# Patient Record
Sex: Male | Born: 1950 | Race: White | Hispanic: No | Marital: Married | Smoking: Former smoker
Health system: Southern US, Community
[De-identification: ages and names within clinical notes are randomized; demographics above are authoritative.]

## PROBLEM LIST (undated history)

## (undated) DIAGNOSIS — M545 Low back pain, unspecified: Secondary | ICD-10-CM

## (undated) DIAGNOSIS — N529 Male erectile dysfunction, unspecified: Secondary | ICD-10-CM

## (undated) DIAGNOSIS — Z860100 Personal history of colon polyps, unspecified: Secondary | ICD-10-CM

## (undated) DIAGNOSIS — F32A Depression, unspecified: Secondary | ICD-10-CM

## (undated) DIAGNOSIS — F419 Anxiety disorder, unspecified: Secondary | ICD-10-CM

## (undated) DIAGNOSIS — T7840XA Allergy, unspecified, initial encounter: Secondary | ICD-10-CM

## (undated) DIAGNOSIS — K219 Gastro-esophageal reflux disease without esophagitis: Secondary | ICD-10-CM

## (undated) DIAGNOSIS — E669 Obesity, unspecified: Secondary | ICD-10-CM

## (undated) DIAGNOSIS — G4733 Obstructive sleep apnea (adult) (pediatric): Secondary | ICD-10-CM

## (undated) DIAGNOSIS — F329 Major depressive disorder, single episode, unspecified: Secondary | ICD-10-CM

## (undated) DIAGNOSIS — Z9989 Dependence on other enabling machines and devices: Secondary | ICD-10-CM

## (undated) DIAGNOSIS — E119 Type 2 diabetes mellitus without complications: Secondary | ICD-10-CM

## (undated) DIAGNOSIS — R7309 Other abnormal glucose: Secondary | ICD-10-CM

## (undated) DIAGNOSIS — M1990 Unspecified osteoarthritis, unspecified site: Secondary | ICD-10-CM

## (undated) HISTORY — DX: Dependence on other enabling machines and devices: Z99.89

## (undated) HISTORY — DX: Allergy, unspecified, initial encounter: T78.40XA

## (undated) HISTORY — DX: Low back pain: M54.5

## (undated) HISTORY — DX: Obesity, unspecified: E66.9

## (undated) HISTORY — PX: KNEE LIGAMENT RECONSTRUCTION: SHX1895

## (undated) HISTORY — DX: Gastro-esophageal reflux disease without esophagitis: K21.9

## (undated) HISTORY — DX: Low back pain, unspecified: M54.50

## (undated) HISTORY — DX: Type 2 diabetes mellitus without complications: E11.9

## (undated) HISTORY — DX: Obstructive sleep apnea (adult) (pediatric): G47.33

## (undated) HISTORY — DX: Personal history of colonic polyps: Z86.010

## (undated) HISTORY — DX: Depression, unspecified: F32.A

## (undated) HISTORY — PX: TONSILLECTOMY: SUR1361

## (undated) HISTORY — DX: Major depressive disorder, single episode, unspecified: F32.9

## (undated) HISTORY — DX: Anxiety disorder, unspecified: F41.9

## (undated) HISTORY — PX: UPPER GASTROINTESTINAL ENDOSCOPY: SHX188

## (undated) HISTORY — PX: COLONOSCOPY: SHX174

## (undated) HISTORY — DX: Other abnormal glucose: R73.09

## (undated) HISTORY — DX: Personal history of colon polyps, unspecified: Z86.0100

## (undated) HISTORY — DX: Male erectile dysfunction, unspecified: N52.9

---
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
## 1979-02-20 HISTORY — PX: VARICOSE VEIN SURGERY: SHX832

## 1997-09-19 ENCOUNTER — Ambulatory Visit: Admission: RE | Admit: 1997-09-19 | Discharge: 1997-09-19 | Payer: Self-pay | Admitting: Pulmonary Disease

## 1998-01-19 ENCOUNTER — Ambulatory Visit: Admission: RE | Admit: 1998-01-19 | Discharge: 1998-01-19 | Payer: Self-pay | Admitting: Pulmonary Disease

## 1998-11-27 ENCOUNTER — Encounter: Admission: RE | Admit: 1998-11-27 | Discharge: 1998-11-27 | Payer: Self-pay | Admitting: *Deleted

## 1998-11-27 ENCOUNTER — Encounter: Payer: Self-pay | Admitting: Internal Medicine

## 2006-01-10 ENCOUNTER — Ambulatory Visit: Payer: Self-pay | Admitting: Internal Medicine

## 2006-01-20 ENCOUNTER — Ambulatory Visit: Payer: Self-pay

## 2006-02-15 ENCOUNTER — Ambulatory Visit: Payer: Self-pay | Admitting: Internal Medicine

## 2006-07-04 ENCOUNTER — Ambulatory Visit: Payer: Self-pay | Admitting: Internal Medicine

## 2006-10-03 ENCOUNTER — Ambulatory Visit: Payer: Self-pay | Admitting: Internal Medicine

## 2007-04-19 ENCOUNTER — Encounter: Payer: Self-pay | Admitting: Internal Medicine

## 2007-04-19 ENCOUNTER — Ambulatory Visit: Payer: Self-pay | Admitting: Internal Medicine

## 2007-04-19 DIAGNOSIS — F329 Major depressive disorder, single episode, unspecified: Secondary | ICD-10-CM

## 2007-04-19 DIAGNOSIS — F411 Generalized anxiety disorder: Secondary | ICD-10-CM | POA: Insufficient documentation

## 2007-04-19 DIAGNOSIS — K219 Gastro-esophageal reflux disease without esophagitis: Secondary | ICD-10-CM | POA: Insufficient documentation

## 2007-05-01 ENCOUNTER — Ambulatory Visit: Payer: Self-pay | Admitting: Internal Medicine

## 2007-05-01 LAB — CONVERTED CEMR LAB
ALT: 23 units/L (ref 0–53)
AST: 24 units/L (ref 0–37)
Albumin: 4 g/dL (ref 3.5–5.2)
Alkaline Phosphatase: 48 units/L (ref 39–117)
BUN: 11 mg/dL (ref 6–23)
Basophils Absolute: 0.1 10*3/uL (ref 0.0–0.1)
Basophils Relative: 0.9 % (ref 0.0–1.0)
Bilirubin, Direct: 0.1 mg/dL (ref 0.0–0.3)
CO2: 30 meq/L (ref 19–32)
Calcium: 9 mg/dL (ref 8.4–10.5)
Chloride: 104 meq/L (ref 96–112)
Cholesterol: 149 mg/dL (ref 0–200)
Creatinine, Ser: 1.3 mg/dL (ref 0.4–1.5)
Eosinophils Absolute: 0.2 10*3/uL (ref 0.0–0.6)
Eosinophils Relative: 3.2 % (ref 0.0–5.0)
GFR calc Af Amer: 73 mL/min
GFR calc non Af Amer: 61 mL/min
Glucose, Bld: 115 mg/dL — ABNORMAL HIGH (ref 70–99)
HCT: 45.6 % (ref 39.0–52.0)
HDL: 31.2 mg/dL — ABNORMAL LOW (ref >39.0)
Hemoglobin: 15.8 g/dL (ref 13.0–17.0)
LDL Cholesterol: 104 mg/dL — ABNORMAL HIGH (ref 0–99)
Lymphocytes Relative: 28 % (ref 12.0–46.0)
MCHC: 34.6 g/dL (ref 30.0–36.0)
MCV: 88.8 fL (ref 78.0–100.0)
Monocytes Absolute: 0.7 10*3/uL (ref 0.2–0.7)
Monocytes Relative: 10.8 % (ref 3.0–11.0)
Neutro Abs: 3.8 10*3/uL (ref 1.4–7.7)
Neutrophils Relative %: 57.1 % (ref 43.0–77.0)
PSA: 0.84 ng/mL (ref 0.10–4.00)
Platelets: 203 10*3/uL (ref 150–400)
Potassium: 4 meq/L (ref 3.5–5.1)
RBC: 5.14 M/uL (ref 4.22–5.81)
RDW: 12.7 % (ref 11.5–14.6)
Sodium: 140 meq/L (ref 135–145)
TSH: 1.76 microintl units/mL (ref 0.35–5.50)
Total Bilirubin: 0.9 mg/dL (ref 0.3–1.2)
Total CHOL/HDL Ratio: 4.8
Total Protein: 6.8 g/dL (ref 6.0–8.3)
Triglycerides: 69 mg/dL (ref 0–149)
VLDL: 14 mg/dL (ref 0–40)
WBC: 6.6 10*3/uL (ref 4.5–10.5)

## 2007-05-02 ENCOUNTER — Encounter: Payer: Self-pay | Admitting: Internal Medicine

## 2007-06-22 DIAGNOSIS — R7309 Other abnormal glucose: Secondary | ICD-10-CM

## 2007-06-22 HISTORY — PX: OTHER SURGICAL HISTORY: SHX169

## 2007-06-22 HISTORY — DX: Other abnormal glucose: R73.09

## 2007-10-23 ENCOUNTER — Ambulatory Visit: Payer: Self-pay | Admitting: Internal Medicine

## 2007-10-23 DIAGNOSIS — R7309 Other abnormal glucose: Secondary | ICD-10-CM | POA: Insufficient documentation

## 2007-10-23 DIAGNOSIS — R635 Abnormal weight gain: Secondary | ICD-10-CM | POA: Insufficient documentation

## 2007-10-25 LAB — CONVERTED CEMR LAB
BUN: 21 mg/dL (ref 6–23)
CO2: 32 meq/L (ref 19–32)
Calcium: 9.1 mg/dL (ref 8.4–10.5)
Chloride: 103 meq/L (ref 96–112)
Creatinine, Ser: 1.4 mg/dL (ref 0.4–1.5)
GFR calc Af Amer: 67 mL/min
GFR calc non Af Amer: 56 mL/min
Glucose, Bld: 111 mg/dL — ABNORMAL HIGH (ref 70–99)
Potassium: 4.2 meq/L (ref 3.5–5.1)
Sodium: 142 meq/L (ref 135–145)

## 2007-10-30 ENCOUNTER — Telehealth: Payer: Self-pay | Admitting: Internal Medicine

## 2007-12-11 ENCOUNTER — Encounter: Admission: RE | Admit: 2007-12-11 | Discharge: 2007-12-11 | Payer: Self-pay | Admitting: Orthopedic Surgery

## 2008-02-09 ENCOUNTER — Ambulatory Visit (HOSPITAL_COMMUNITY): Admission: RE | Admit: 2008-02-09 | Discharge: 2008-02-09 | Payer: Self-pay | Admitting: Orthopedic Surgery

## 2008-04-29 ENCOUNTER — Ambulatory Visit: Payer: Self-pay | Admitting: Internal Medicine

## 2008-04-30 LAB — CONVERTED CEMR LAB
AST: 26 units/L (ref 0–37)
Albumin: 4.3 g/dL (ref 3.5–5.2)
Alkaline Phosphatase: 50 units/L (ref 39–117)
BUN: 15 mg/dL (ref 6–23)
Bilirubin Urine: NEGATIVE
Bilirubin, Direct: 0.1 mg/dL (ref 0.0–0.3)
Chloride: 105 meq/L (ref 96–112)
Eosinophils Absolute: 0.3 10*3/uL (ref 0.0–0.7)
Eosinophils Relative: 4.3 % (ref 0.0–5.0)
GFR calc Af Amer: 80 mL/min
GFR calc non Af Amer: 66 mL/min
Glucose, Bld: 113 mg/dL — ABNORMAL HIGH (ref 70–99)
HDL: 29.6 mg/dL — ABNORMAL LOW (ref >39.0)
Hemoglobin, Urine: NEGATIVE
LDL Cholesterol: 126 mg/dL — ABNORMAL HIGH (ref 0–99)
Monocytes Absolute: 0.7 10*3/uL (ref 0.1–1.0)
Monocytes Relative: 10 % (ref 3.0–12.0)
Neutrophils Relative %: 61 % (ref 43.0–77.0)
Nitrite: NEGATIVE
Platelets: 219 10*3/uL (ref 150–400)
Potassium: 5.1 meq/L (ref 3.5–5.1)
RDW: 13.3 % (ref 11.5–14.6)
Sodium: 142 meq/L (ref 135–145)
Total CHOL/HDL Ratio: 6.3
Urobilinogen, UA: 0.2 (ref 0.0–1.0)
VLDL: 30 mg/dL (ref 0–40)
WBC: 7.1 10*3/uL (ref 4.5–10.5)

## 2008-05-03 ENCOUNTER — Ambulatory Visit: Payer: Self-pay | Admitting: Internal Medicine

## 2008-05-09 DIAGNOSIS — M545 Low back pain, unspecified: Secondary | ICD-10-CM | POA: Insufficient documentation

## 2008-05-09 DIAGNOSIS — J309 Allergic rhinitis, unspecified: Secondary | ICD-10-CM | POA: Insufficient documentation

## 2008-06-19 ENCOUNTER — Ambulatory Visit: Payer: Self-pay | Admitting: Internal Medicine

## 2008-07-11 ENCOUNTER — Telehealth: Payer: Self-pay | Admitting: Internal Medicine

## 2008-09-02 ENCOUNTER — Ambulatory Visit: Payer: Self-pay | Admitting: Internal Medicine

## 2009-01-06 ENCOUNTER — Ambulatory Visit: Payer: Self-pay | Admitting: Internal Medicine

## 2009-05-01 ENCOUNTER — Ambulatory Visit: Payer: Self-pay | Admitting: Internal Medicine

## 2009-05-05 LAB — CONVERTED CEMR LAB
ALT: 29 units/L (ref 0–53)
AST: 23 units/L (ref 0–37)
Albumin: 4.2 g/dL (ref 3.5–5.2)
Alkaline Phosphatase: 56 units/L (ref 39–117)
BUN: 12 mg/dL (ref 6–23)
Basophils Relative: 0.7 % (ref 0.0–3.0)
CO2: 28 meq/L (ref 19–32)
Cholesterol: 176 mg/dL (ref 0–200)
Eosinophils Relative: 2.4 % (ref 0.0–5.0)
Glucose, Bld: 127 mg/dL — ABNORMAL HIGH (ref 70–99)
HCT: 46.7 % (ref 39.0–52.0)
Hemoglobin: 15.9 g/dL (ref 13.0–17.0)
Hgb A1c MFr Bld: 6.1 % (ref 4.6–6.5)
Ketones, ur: NEGATIVE mg/dL
Leukocytes, UA: NEGATIVE
Lymphs Abs: 1.6 10*3/uL (ref 0.7–4.0)
MCV: 91.4 fL (ref 78.0–100.0)
Monocytes Relative: 9.4 % (ref 3.0–12.0)
Neutro Abs: 5 10*3/uL (ref 1.4–7.7)
Nitrite: NEGATIVE
Platelets: 214 10*3/uL (ref 150.0–400.0)
Potassium: 4.5 meq/L (ref 3.5–5.1)
RBC: 5.11 M/uL (ref 4.22–5.81)
Sodium: 142 meq/L (ref 135–145)
Specific Gravity, Urine: >=1.03 (ref 1.000–1.030)
TSH: 1.27 microintl units/mL (ref 0.35–5.50)
Total Protein, Urine: NEGATIVE mg/dL
Total Protein: 7.3 g/dL (ref 6.0–8.3)
WBC: 7.6 10*3/uL (ref 4.5–10.5)
pH: 5.5 (ref 5.0–8.0)

## 2009-05-06 ENCOUNTER — Ambulatory Visit: Payer: Self-pay | Admitting: Internal Medicine

## 2009-05-06 DIAGNOSIS — Z87891 Personal history of nicotine dependence: Secondary | ICD-10-CM

## 2009-05-06 DIAGNOSIS — N529 Male erectile dysfunction, unspecified: Secondary | ICD-10-CM | POA: Insufficient documentation

## 2009-09-03 ENCOUNTER — Ambulatory Visit: Payer: Self-pay | Admitting: Internal Medicine

## 2009-09-03 LAB — CONVERTED CEMR LAB
Chloride: 106 meq/L (ref 96–112)
GFR calc non Af Amer: 60.11 mL/min (ref >60)
Glucose, Bld: 120 mg/dL — ABNORMAL HIGH (ref 70–99)
Hgb A1c MFr Bld: 5.9 % (ref 4.6–6.5)
Potassium: 4.5 meq/L (ref 3.5–5.1)
Sodium: 141 meq/L (ref 135–145)

## 2009-09-08 ENCOUNTER — Ambulatory Visit: Payer: Self-pay | Admitting: Internal Medicine

## 2010-01-01 ENCOUNTER — Ambulatory Visit: Payer: Self-pay | Admitting: Internal Medicine

## 2010-01-01 LAB — CONVERTED CEMR LAB
Chloride: 103 meq/L (ref 96–112)
Hgb A1c MFr Bld: 6.2 % (ref 4.6–6.5)
Potassium: 4.6 meq/L (ref 3.5–5.1)

## 2010-01-06 ENCOUNTER — Ambulatory Visit: Payer: Self-pay | Admitting: Internal Medicine

## 2010-04-30 ENCOUNTER — Ambulatory Visit: Payer: Self-pay | Admitting: Internal Medicine

## 2010-04-30 LAB — CONVERTED CEMR LAB
Albumin: 4.1 g/dL (ref 3.5–5.2)
Alkaline Phosphatase: 57 units/L (ref 39–117)
Basophils Relative: 0.3 % (ref 0.0–3.0)
CO2: 26 meq/L (ref 19–32)
Chloride: 105 meq/L (ref 96–112)
Cholesterol: 197 mg/dL (ref 0–200)
Eosinophils Absolute: 0.2 10*3/uL (ref 0.0–0.7)
HCT: 45.6 % (ref 39.0–52.0)
Hemoglobin: 15.5 g/dL (ref 13.0–17.0)
Ketones, ur: NEGATIVE mg/dL
Leukocytes, UA: NEGATIVE
MCHC: 34 g/dL (ref 30.0–36.0)
MCV: 89.9 fL (ref 78.0–100.0)
Monocytes Absolute: 0.7 10*3/uL (ref 0.1–1.0)
Neutro Abs: 5.6 10*3/uL (ref 1.4–7.7)
PSA: 0.63 ng/mL (ref 0.10–4.00)
RBC: 5.07 M/uL (ref 4.22–5.81)
Sodium: 140 meq/L (ref 135–145)
Specific Gravity, Urine: 1.025 (ref 1.000–1.030)
Total CHOL/HDL Ratio: 5
Total Protein: 6.9 g/dL (ref 6.0–8.3)
Triglycerides: 153 mg/dL — ABNORMAL HIGH (ref 0.0–149.0)
pH: 5.5 (ref 5.0–8.0)

## 2010-05-05 ENCOUNTER — Encounter: Payer: Self-pay | Admitting: Internal Medicine

## 2010-05-05 ENCOUNTER — Ambulatory Visit: Payer: Self-pay | Admitting: Internal Medicine

## 2010-05-05 DIAGNOSIS — Z860100 Personal history of colon polyps, unspecified: Secondary | ICD-10-CM | POA: Insufficient documentation

## 2010-05-05 DIAGNOSIS — M79609 Pain in unspecified limb: Secondary | ICD-10-CM

## 2010-07-21 NOTE — Assessment & Plan Note (Signed)
Summary: 4 MTH PHYSICAL--STC   Vital Signs:  Patient profile:   60 year old male Height:      70 inches Weight:      267 pounds BMI:     38.45 Temp:     98.5 degrees F oral Pulse rate:   68 / minute Pulse rhythm:   regular Resp:     16 per minute BP sitting:   102 / 76  (left arm) Cuff size:   large  Vitals Entered By: Lanier Prude, Beverly Gust) (May 05, 2010 2:13 PM) CC: CPX Is Patient Diabetic? No Comments pt is no longer Alprazolam   CC:  CPX.  History of Present Illness: The patient presents for a preventive health examination  C/o pain in LLE >> LBP x months off and on worse w/standing and better w/exercise   Current Medications (verified): 1)  Alprazolam 0.5 Mg Tabs (Alprazolam) .... Take 1 or 1/2  Tablet By Mouth Two Times A Day As Needed Anxiety 2)  Viagra 100 Mg Tabs (Sildenafil Citrate) .Marland Kitchen.. 1 Once Daily Prn 3)  Vitamin D3 1000 Unit  Tabs (Cholecalciferol) .Marland Kitchen.. 1 By Mouth Daily 4)  Hydrocodone-Acetaminophen 5-325 Mg Tabs (Hydrocodone-Acetaminophen) .Marland Kitchen.. 1 - 2 By Mouth Three Times A Day As Needed Pain 5)  Nexium 40 Mg Cpdr (Esomeprazole Magnesium) .... Take 1 Tab Each Morning 6)  Cpap Machine .... Dx [facility]  Allergies (verified): 1)  ! 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  The patient denies anorexia, fever, weight loss,  vision loss, decreased hearing, hoarseness, chest pain, syncope, dyspnea on exertion, peripheral edema, prolonged cough, headaches, hemoptysis, abdominal pain, melena, hematochezia, severe indigestion/heartburn, hematuria, incontinence, genital sores, muscle weakness, suspicious skin lesions, transient blindness, difficulty walking, depression, unusual weight change, abnormal bleeding, enlarged lymph nodes, angioedema, and testicular masses.    Physical Exam  General:  alert and overweight-appearing.   Head:  Normocephalic and atraumatic without obvious abnormalities. No apparent alopecia or balding. Eyes:  No corneal or conjunctival inflammation noted. EOMI. Perrla. Ears:  External ear exam shows no significant lesions or deformities.  Otoscopic examination reveals clear canals, tympanic membranes are intact bilaterally without bulging, retraction, inflammation or discharge. Hearing is grossly normal bilaterally. Nose:  External nasal examination shows no deformity or inflammation. Nasal mucosa are pink and moist without lesions or exudates. Mouth:  Oral mucosa and oropharynx without lesions or exudates.  Teeth in good repair. Neck:  No deformities, masses, or tenderness noted. Lungs:  Normal respiratory effort, chest expands symmetrically. Lungs are clear to auscultation, no crackles or wheezes. Heart:  Normal rate and regular rhythm. S1 and S2 normal without gallop, murmur, click, rub or other extra sounds. Abdomen:  Bowel sounds positive,abdomen soft and non-tender without masses, organomegaly or hernias noted. Rectal:  No external abnormalities noted. Normal sphincter tone. No rectal masses  or tenderness. G(-) Prostate:  1+ enlarged.   Msk:  LS NT LLE NT Extremities:  no edema, no ulcers  Neurologic:  cranial nerves II-XII intact and strength normal in all extremities.  DTRs ok Skin:  Intact without suspicious lesions or rashes Cervical Nodes:  No lymphadenopathy noted Psych:  Cognition  and judgment appear intact. Alert and cooperative with normal attention span and concentration. No apparent delusions, illusions, hallucinations   Impression & Recommendations:  Problem # 1:  ROUTINE GENERAL MEDICAL EXAM@HEALTH  CARE FACL (ICD-V70.0) Assessment New Health and age related issues were discussed. Available screening tests and vaccinations were discussed as well. Healthy life style including good diet and exercise was discussed.  The labs were reviewed with the patient.  Quick B carot [place] was OK Orders: EKG w/ Interpretation (93000)  Problem # 2:  LOW BACK PAIN (ICD-724.2) - OA Assessment: Unchanged  His updated medication list for this problem includes:    Hydrocodone-acetaminophen 5-325 Mg Tabs (Hydrocodone-acetaminophen) .Marland Kitchen... 1 - 2 by mouth three times a day as needed pain    Naproxen 500 Mg Tabs (Naproxen) .Marland Kitchen... 1 by mouth two times a day pc for pain/arthritis  Problem # 3:  LEG PAIN (ICD-729.5) L ? etiol - poss MSK IT band Assessment: Deteriorated See "Patient Instructions".  Pt declined xrays, PT  Problem # 4:  ERECTILE DYSFUNCTION (ICD-607.84) Assessment: Unchanged  His updated medication list for this problem includes:    Viagra 100 Mg Tabs (Sildenafil citrate) .Marland Kitchen... 1 once daily prn  Problem # 5:  HYPERGLYCEMIA (ICD-790.29) Assessment: Unchanged See "Patient Instructions". Loose wt  Complete Medication List: 1)  Viagra 100 Mg Tabs (Sildenafil citrate) .Marland Kitchen.. 1 once daily prn 2)  Vitamin D3 1000 Unit Tabs (Cholecalciferol) .Marland Kitchen.. 1 by mouth daily 3)  Hydrocodone-acetaminophen 5-325 Mg Tabs (Hydrocodone-acetaminophen) .Marland Kitchen.. 1 - 2 by mouth three times a day as needed pain 4)  Nexium 40 Mg Cpdr (Esomeprazole magnesium) .... Take 1 tab each morning 5)  Cpap Machine  .... Dx osa 6)  Naproxen 500 Mg Tabs (Naproxen) .Marland Kitchen.. 1 by mouth two times a day pc for pain/arthritis  Other Orders: Tdap => 28yrs IM (81191) Admin 1st Vaccine (47829) Admin 1st Vaccine (56213) Flu  Vaccine 64yrs + (08657)  Anticoagulation Management Assessment/Plan:            Patient Instructions: 1)  Go on Youtube (www.youtube.com) and look up "piriformis stretch", "Ileopsoas stretch",  "IT band stretch" and "gluteus stretch". See the anatomy and learn the symptoms.  You can try to self-diagnose. Do the stretches - it may help!  2)  Please schedule a follow-up appointment in 6 months. 3)  BMP prior to visit, ICD-9: 4)  HbgA1C prior to visit, ICD-9: 790.29 Prescriptions: NEXIUM 40 MG CPDR (ESOMEPRAZOLE MAGNESIUM) Take 1 tab each morning  #90 x 12   Entered and Authorized by:   Tresa Garter MD   Signed by:   Tresa Garter MD on 05/05/2010   Method used:   Print then Give to Patient   RxID:   [id number] HYDROCODONE-ACETAMINOPHEN 5-325 MG TABS (HYDROCODONE-ACETAMINOPHEN) 1 - 2 by mouth three times a day as needed pain  #90 x 2   Entered and Authorized by:   Tresa Garter MD   Signed by:   Tresa Garter MD on 05/05/2010   Method used:   Print then Give to Patient   RxID:   [id number] VIAGRA 100 MG TABS (SILDENAFIL CITRATE) 1 once daily prn  #12 x  12   Entered and Authorized by:   Tresa Garter MD   Signed by:   Tresa Garter MD on 05/05/2010   Method used:   Print then Give to Patient   RxID:   [id number] NAPROXEN 500 MG TABS (NAPROXEN) 1 by mouth two times a day pc for pain/arthritis  #60 x 3   Entered and Authorized by:   Tresa Garter MD   Signed by:   Tresa Garter MD on 05/05/2010   Method used:   Print then Give to Patient   RxID:   [id number]    Orders Added: 1)  EKG w/ Interpretation [93000] 2)  Est. Patient age 39-64 [62] 3)  Tdap => 64yrs IM [90715] 4)  Admin 1st Vaccine [90471] 5)  Admin 1st Vaccine [90471] 6)  Flu Vaccine 56yrs + [95284]   Immunizations Administered:  Tetanus Vaccine:    Vaccine Type: Tdap    Site: left deltoid    Mfr: GlaxoSmithKline    Dose: 0.5 ml    Route:  IM    Given by: Lanier Prude, CMA(AAMA)    Exp. Date: 04/09/2012    Lot #: [id number]    VIS given: 05/08/08 version given May 05, 2010.   Immunizations Administered:  Tetanus Vaccine:    Vaccine Type: Tdap    Site: left deltoid    Mfr: GlaxoSmithKline    Dose: 0.5 ml    Route: IM    Given by: Lanier Prude, CMA(AAMA)    Exp. Date: 04/09/2012    Lot #: [id number]    VIS given: 05/08/08 version given May 05, 2010.  Influenza Vaccine (to be given today)      Flu Vaccine Consent Questions     Do you have a history of severe allergic reactions to this vaccine? no    Any prior history of allergic reactions to egg and/or gelatin? no    Do you have a sensitivity to the preservative Thimersol? no    Do you have a past history of Guillan-Barre Syndrome? no    Do you currently have an acute febrile illness? no    Have you ever had a severe reaction to latex? no    Vaccine information given and explained to patient? yes    Are you currently pregnant? no    Lot Number:[id number]   Exp Date:12/19/2010   Site Given  Right Deltoid IM Lanier Prude, [facility])  May 05, 2010 3:10 PM

## 2010-07-21 NOTE — Assessment & Plan Note (Signed)
Summary: 4 MTH FU STC   Vital Signs:  Patient profile:   60 year old male Height:      70 inches (177.80 cm) Weight:      266 pounds (120.91 kg) BMI:     38.30 O2 Sat:      96 % on Room air Temp:     97.4 degrees F (36.33 degrees C) oral Pulse rate:   66 / minute Pulse rhythm:   regular Resp:     16 per minute BP sitting:   118 / 62  (left arm) Cuff size:   large  Vitals Entered By: Lanier Prude, CMA(AAMA) (January 06, 2010 2:00 PM)  O2 Flow:  Room air CC: 4 mo f/u Is Patient Diabetic? No   CC:  4 mo f/u.  History of Present Illness: The patient presents for a follow up of hypertension, elev glu, hyperlipidemia , ED His mom died in a NH  Current Medications (verified): 1)  Alprazolam 0.5 Mg Tabs (Alprazolam) .... Take 1 or 1/2  Tablet By Mouth Two Times A Day As Needed Anxiety 2)  Viagra 100 Mg Tabs (Sildenafil Citrate) .Marland Kitchen.. 1 Once Daily Prn 3)  Vitamin D3 1000 Unit  Tabs (Cholecalciferol) .Marland Kitchen.. 1 By Mouth Daily 4)  Hydrocodone-Acetaminophen 5-325 Mg Tabs (Hydrocodone-Acetaminophen) .Marland Kitchen.. 1 - 2 By Mouth Three Times A Day As Needed Pain 5)  Nexium 40 Mg Cpdr (Esomeprazole Magnesium) .... Take 1 Tab Each Morning  Allergies (verified): 1)  ! 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    Physical Exam  General:  alert and overweight-appearing.   Nose:  External nasal examination shows no deformity or inflammation. Nasal mucosa are pink and moist without lesions or exudates. Mouth:  Oral mucosa and oropharynx without lesions or exudates.  Teeth in good repair. Lungs:  Normal respiratory effort, chest expands symmetrically. Lungs are clear to auscultation, no crackles or wheezes. Heart:  Normal rate and regular rhythm. S1 and S2 normal without gallop, murmur, click, rub or other extra sounds. Abdomen:  Bowel sounds positive,abdomen soft and non-tender without masses, organomegaly or hernias noted. Msk:  Lumbar-sacral spine is tender to palpation over paraspinal muscles and painfull with the ROM  Extremities:  no edema, no ulcers  Neurologic:  cranial nerves II-XII intact and strength normal in all extremities.   Skin:  Intact without suspicious lesions or rashes Psych:  Cognition and judgment appear intact. Alert and cooperative with normal attention span and concentration.He is not depressed   Impression & Recommendations:  Problem # 1:  DEPRESSION (ICD-311) Assessment Improved He is grieving. 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 Discussed. His updated medication list for this problem includes:    Alprazolam 0.5 Mg Tabs (Alprazolam) .Marland Kitchen... Take  1 or 1/2  tablet by mouth two times a day as needed anxiety  Problem # 2:  GERD (ICD-530.81) Assessment: Deteriorated  His updated medication list for this problem includes:    Nexium 40 Mg Cpdr (Esomeprazole magnesium) .Marland Kitchen... Take 1 tab each morning  Problem # 3:  LOW BACK PAIN (ICD-724.2) Assessment: Unchanged  His updated medication list for this problem includes:    Hydrocodone-acetaminophen 5-325 Mg Tabs (Hydrocodone-acetaminophen) .Marland Kitchen... 1 - 2 by mouth three times a day as needed pain  Problem # 4:  ANXIETY (ICD-300.00) Assessment:  Improved  His updated medication list for this problem includes:    Alprazolam 0.5 Mg Tabs (Alprazolam) .Marland Kitchen... Take 1 or 1/2  tablet by mouth two times a day as needed anxiety  Problem # 5:  OBSTRUCTIVE SLEEP APNEA (ICD-327.23) Assessment: Unchanged CPAP Risks of noncompliance with treatment discussed. Compliance encouraged.   Complete Medication List: 1)  Alprazolam 0.5 Mg Tabs (Alprazolam) .... Take 1 or 1/2  tablet by mouth two times a day as needed anxiety 2)  Viagra 100 Mg Tabs (Sildenafil citrate) .Marland Kitchen.. 1 once daily prn 3)  Vitamin D3 1000 Unit Tabs (Cholecalciferol) .Marland Kitchen.. 1 by mouth daily 4)  Hydrocodone-acetaminophen 5-325 Mg Tabs (Hydrocodone-acetaminophen) .Marland Kitchen.. 1 - 2 by mouth three times a day as needed pain 5)  Nexium 40 Mg Cpdr (Esomeprazole magnesium) .... Take 1 tab each morning 6)  Cpap Machine  .... Dx osa  Patient Instructions: 1)  Please schedule a follow-up appointment in 4 months well w/labs v70.0. Prescriptions: HYDROCODONE-ACETAMINOPHEN 5-325 MG TABS (HYDROCODONE-ACETAMINOPHEN) 1 - 2 by mouth three times a day as needed pain  #90 x 2   Entered and Authorized by:   Tresa Garter MD   Signed by:   Tresa Garter MD on 01/06/2010   Method used:   Print then Give to Patient   RxID:   [id number] NEXIUM 40 MG CPDR (ESOMEPRAZOLE MAGNESIUM) Take 1 tab each morning  #90 x 12   Entered and Authorized by:   Tresa Garter MD   Signed by:   Tresa Garter MD on 01/06/2010   Method used:   Print then Give to Patient   RxID:   [id number] VIAGRA 100 MG TABS (SILDENAFIL CITRATE) 1 once daily prn  #12 x 12   Entered and Authorized by:   Tresa Garter MD   Signed by:   Tresa Garter MD on 01/06/2010   Method used:   Print then Give to Patient   RxID:   [id number] ALPRAZOLAM 0.5 MG TABS (ALPRAZOLAM) Take 1 or 1/2  tablet by mouth two times a day as needed anxiety  #60 x 6   Entered and Authorized by:   Tresa Garter MD    Signed by:   Tresa Garter MD on 01/06/2010   Method used:   Print then Give to Patient   RxID:   [id number] CPAP MACHINE Dx OSA  #1 x 0   Entered and Authorized by:   Tresa Garter MD   Signed by:   Tresa Garter MD on 01/06/2010   Method used:   Print then Give to Patient   RxID:   [id number]

## 2010-07-21 NOTE — Assessment & Plan Note (Signed)
Summary: 4 mos f/u #/cd   Vital Signs:  Patient profile:   60 year old male Height:      70 inches Weight:      267 pounds BMI:     38.45 Temp:     99.1 degrees F oral Pulse rate:   63 / minute BP sitting:   112 / 80  (left arm)  Vitals Entered By: Tora Perches (September 08, 2009 1:36 PM) CC: f/u Is Patient Diabetic? No   CC:  f/u.  History of Present Illness: The patient presents for a follow up of GERD, anxiety. C/o depression, mom is sick at a NH.    Preventive Screening-Counseling & Management  Alcohol-Tobacco     Smoking Status: quit  Current Medications (verified): 1)  Alprazolam 0.5 Mg Tabs (Alprazolam) .... Take 1 or 1/2  Tablet By Mouth Two Times A Day As Needed Anxiety 2)  Viagra 100 Mg Tabs (Sildenafil Citrate) .Marland Kitchen.. 1 Once Daily Prn 3)  Vitamin D3 1000 Unit  Tabs (Cholecalciferol) .Marland Kitchen.. 1 By Mouth Daily 4)  Hydrocodone-Acetaminophen 5-325 Mg Tabs (Hydrocodone-Acetaminophen) .Marland Kitchen.. 1 - 2 By Mouth Three Times A Day As Needed Pain 5)  Nexium 40 Mg Cpdr (Esomeprazole Magnesium) .... Take 1 Tab Each Morning  Allergies: 1)  ! 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 Depression GERD Obesity ED OSA on CPAP Elev glu 2009 Allergic rhinitis Anxiety Low back pain  Review of Systems       The patient complains of weight gain and depression.  The patient denies dyspnea on exertion and abdominal pain.    Physical Exam  General:  alert and overweight-appearing.   Nose:  External nasal examination shows no deformity or inflammation. Nasal mucosa are pink and moist without lesions or exudates. Mouth:  Oral mucosa and oropharynx  without lesions or exudates.  Teeth in good repair. Lungs:  Normal respiratory effort, chest expands symmetrically. Lungs are clear to auscultation, no crackles or wheezes. Heart:  Normal rate and regular rhythm. S1 and S2 normal without gallop, murmur, click, rub or other extra sounds. Abdomen:  Bowel sounds positive,abdomen soft and non-tender without masses, organomegaly or hernias noted. Msk:  Lumbar-sacral spine is tender to palpation over paraspinal muscles and painfull with the ROM  Extremities:  no edema, no ulcers  Neurologic:  cranial nerves II-XII intact and strength normal in all extremities.   Skin:  Intact without suspicious lesions or rashes Psych:  Cognition and judgment appear intact. Alert and cooperative with normal attention span and concentration. No apparent delusions, illusions, hallucinations   Impression & Recommendations:  Problem # 1:  ANXIETY (ICD-300.00) Assessment Deteriorated  His updated medication list for this problem includes:    Alprazolam 0.5 Mg Tabs (Alprazolam) .Marland Kitchen... Take 1 or 1/2  tablet by mouth two times a day as needed anxiety  Problem # 2:  DEPRESSION (ICD-311) Assessment: Deteriorated  His updated medication list for this problem includes:    Alprazolam 0.5 Mg Tabs (Alprazolam) .Marland Kitchen... Take 1 or 1/2  tablet by mouth two times a day as needed anxiety  Problem # 3:  WEIGHT GAIN (ICD-783.1) Assessment: Deteriorated  Problem # 4:  GERD (ICD-530.81) Assessment:  Unchanged  His updated medication list for this problem includes:    Nexium 40 Mg Cpdr (Esomeprazole magnesium) .Marland Kitchen... Take 1 tab each morning  Complete Medication List: 1)  Alprazolam 0.5 Mg Tabs (Alprazolam) .... Take 1 or 1/2  tablet by mouth two times a day as needed anxiety 2)  Viagra 100 Mg Tabs (Sildenafil citrate) .Marland Kitchen.. 1 once daily prn 3)  Vitamin D3 1000 Unit Tabs (Cholecalciferol) .Marland Kitchen.. 1 by mouth daily 4)  Hydrocodone-acetaminophen 5-325 Mg Tabs (Hydrocodone-acetaminophen)  .Marland Kitchen.. 1 - 2 by mouth three times a day as needed pain 5)  Nexium 40 Mg Cpdr (Esomeprazole magnesium) .... Take 1 tab each morning  Patient Instructions: 1)  Please schedule a follow-up appointment in 4 months. 2)  BMP prior to visit, ICD-9: 3)  HbgA1C prior to visit, ICD-9:790.29 Prescriptions: HYDROCODONE-ACETAMINOPHEN 5-325 MG TABS (HYDROCODONE-ACETAMINOPHEN) 1 - 2 by mouth three times a day as needed pain  #90 x 2   Entered and Authorized by:   Tresa Garter MD   Signed by:   Tresa Garter MD on 09/08/2009   Method used:   Print then Give to Patient   RxID:   [id number] ALPRAZOLAM 0.5 MG TABS (ALPRAZOLAM) Take 1 or 1/2  tablet by mouth two times a day as needed anxiety  #60 x 6   Entered and Authorized by:   Tresa Garter MD   Signed by:   Tresa Garter MD on 09/08/2009   Method used:   Print then Give to Patient   RxID:   [id number]

## 2010-08-19 ENCOUNTER — Telehealth: Payer: Self-pay | Admitting: Internal Medicine

## 2010-08-27 NOTE — Progress Notes (Signed)
Summary: Rf Hydro/Acetam  Phone Note Refill Request   Refills Requested: Medication #1:  HYDROCODONE-ACETAMINOPHEN 5-325 MG TABS 1 - 2 by mouth three times a day as needed pain   Dosage confirmed as above?Dosage Confirmed   Supply Requested: 90   Last Refilled: 07/16/2010  Method Requested: Telephone to Pharmacy Initial call taken by: Lanier Prude, [facility]),  August 19, 2010 5:28 PM  Follow-up for Phone Call        ok to ref Follow-up by: Tresa Garter MD,  August 19, 2010 6:18 PM    Prescriptions: HYDROCODONE-ACETAMINOPHEN 5-325 MG TABS (HYDROCODONE-ACETAMINOPHEN) 1 - 2 by mouth three times a day as needed pain  #90 x 0   Entered by:   Vertis Kelch)   Authorized by:   Tresa Garter MD   Signed by:   Burnard Leigh CMA(AAMA) on 08/20/2010   Method used:   Telephoned to ...       [facility]. [phone]* (retail)       [phone] [address].       [place], [place]  [address]       Ph: [phone]       Fax: [phone]   RxID:   [id number]

## 2010-09-18 ENCOUNTER — Other Ambulatory Visit: Payer: Self-pay | Admitting: Internal Medicine

## 2010-09-18 NOTE — Telephone Encounter (Signed)
Ok to Rf? 

## 2010-09-22 ENCOUNTER — Telehealth: Payer: Self-pay | Admitting: *Deleted

## 2010-09-22 NOTE — Telephone Encounter (Signed)
OK to fill this prescription with additional refills x0 Thank you!  

## 2010-09-22 NOTE — Telephone Encounter (Signed)
rec Rf req for Hydroco/APAP 5-325mg  1 po tid prn pain.... Last filled 08-20-10...Marland Kitchenok to rf??

## 2010-09-23 MED ORDER — HYDROCODONE-ACETAMINOPHEN 5-325 MG PO TABS: 1.0000 | ORAL_TABLET | Freq: Three times a day (TID) | ORAL | Status: DC

## 2010-09-23 NOTE — Telephone Encounter (Signed)
rf phoned in 

## 2010-09-28 NOTE — Telephone Encounter (Signed)
OK to fill this prescription with additional refills x0 Thank you!  

## 2010-09-29 NOTE — Telephone Encounter (Signed)
rf called in on 09-23-10

## 2010-10-27 ENCOUNTER — Other Ambulatory Visit: Payer: Self-pay | Admitting: Internal Medicine

## 2010-10-27 ENCOUNTER — Other Ambulatory Visit: Payer: Self-pay

## 2010-10-27 DIAGNOSIS — R7309 Other abnormal glucose: Secondary | ICD-10-CM

## 2010-10-29 ENCOUNTER — Other Ambulatory Visit (INDEPENDENT_AMBULATORY_CARE_PROVIDER_SITE_OTHER): Payer: 59

## 2010-10-29 DIAGNOSIS — R7309 Other abnormal glucose: Secondary | ICD-10-CM

## 2010-10-29 LAB — BASIC METABOLIC PANEL
CO2: 29 mEq/L (ref 19–32)
Chloride: 105 mEq/L (ref 96–112)
GFR: 74.95 mL/min (ref >60.00)
Glucose, Bld: 114 mg/dL — ABNORMAL HIGH (ref 70–99)
Potassium: 4.4 mEq/L (ref 3.5–5.1)
Sodium: 141 mEq/L (ref 135–145)

## 2010-11-02 ENCOUNTER — Encounter: Payer: Self-pay | Admitting: Internal Medicine

## 2010-11-03 ENCOUNTER — Ambulatory Visit: Payer: Self-pay | Admitting: Internal Medicine

## 2010-11-03 ENCOUNTER — Encounter: Payer: Self-pay | Admitting: Internal Medicine

## 2010-11-03 ENCOUNTER — Ambulatory Visit (INDEPENDENT_AMBULATORY_CARE_PROVIDER_SITE_OTHER): Payer: 59 | Admitting: Internal Medicine

## 2010-11-03 DIAGNOSIS — N529 Male erectile dysfunction, unspecified: Secondary | ICD-10-CM

## 2010-11-03 DIAGNOSIS — F329 Major depressive disorder, single episode, unspecified: Secondary | ICD-10-CM

## 2010-11-03 DIAGNOSIS — R7309 Other abnormal glucose: Secondary | ICD-10-CM

## 2010-11-03 DIAGNOSIS — M545 Low back pain: Secondary | ICD-10-CM

## 2010-11-03 MED ORDER — HYDROCODONE-ACETAMINOPHEN 5-325 MG PO TABS: 1.0000 | ORAL_TABLET | Freq: Three times a day (TID) | ORAL | Status: DC

## 2010-11-03 MED ORDER — ESOMEPRAZOLE MAGNESIUM 40 MG PO CPDR: 40.0000 mg | DELAYED_RELEASE_CAPSULE | Freq: Every day | ORAL | Status: DC

## 2010-11-03 MED ORDER — SILDENAFIL CITRATE 100 MG PO TABS: 100.0000 mg | ORAL_TABLET | Freq: Every day | ORAL | Status: DC | PRN

## 2010-11-03 MED ORDER — HYDROCODONE-ACETAMINOPHEN 5-325 MG PO TABS: 90.0000 | ORAL_TABLET | Freq: Three times a day (TID) | ORAL | Status: DC | PRN

## 2010-11-03 NOTE — Assessment & Plan Note (Signed)
On Rx - chronic

## 2010-11-03 NOTE — Progress Notes (Signed)
  Subjective:    Patient ID: Patrick Baldwin, male    DOB: 03-25-1951, 60 y.o.   MRN: [id number]  HPI   The patient is here to follow up on chronic depression, anxiety, LBP and L knee pain symptoms controlled with medicines some. F/u elev. glu  Review of Systems  Constitutional: Negative for diaphoresis.  HENT: Negative for congestion and ear discharge.   Eyes: Negative for pain.  Respiratory: Negative for stridor.   Cardiovascular: Negative for leg swelling.  Genitourinary: Negative for flank pain.  Musculoskeletal: Positive for back pain and arthralgias. Negative for myalgias, joint swelling and gait problem.  Neurological: Negative for tremors and syncope.  Psychiatric/Behavioral: Negative for confusion.       Objective:   Physical Exam  Constitutional: He is oriented to person, place, and time. He appears well-developed.  HENT:  Mouth/Throat: Oropharynx is clear and moist.  Eyes: Conjunctivae are normal. Pupils are equal, round, and reactive to light.  Neck: Normal range of motion. No JVD present. No thyromegaly present.  Cardiovascular: Normal rate, regular rhythm, normal heart sounds and intact distal pulses.  Exam reveals no gallop and no friction rub.   No murmur heard. Pulmonary/Chest: Effort normal and breath sounds normal. No respiratory distress. He has no wheezes. He has no rales. He exhibits no tenderness.  Abdominal: Soft. Bowel sounds are normal. He exhibits no distension and no mass. There is no tenderness. There is no rebound and no guarding.  Musculoskeletal: Normal range of motion. He exhibits tenderness (LS is tender). He exhibits no edema.  Lymphadenopathy:    He has no cervical adenopathy.  Neurological: He is alert and oriented to person, place, and time. He has normal reflexes. No cranial nerve deficit. He exhibits normal muscle tone. Coordination normal.  Skin: Skin is warm and dry. No rash noted.  Psychiatric: He has a normal mood and affect. His behavior is  normal. Judgment and thought content normal.          Assessment & Plan:  LOW BACK PAIN On Rx - chronic  DEPRESSION Doing fair  Other Abnormal Glucose He has changed diet Wt Readings from Last 3 Encounters:  11/03/10 266 lb (120.657 kg)  05/05/10 267 lb (121.11 kg)  01/06/10 266 lb (120.657 kg)     ERECTILE DYSFUNCTION On Rx

## 2010-11-03 NOTE — Assessment & Plan Note (Signed)
On Rx 

## 2010-11-03 NOTE — Assessment & Plan Note (Signed)
He has changed diet Wt Readings from Last 3 Encounters:  11/03/10 266 lb (120.657 kg)  05/05/10 267 lb (121.11 kg)  01/06/10 266 lb (120.657 kg)

## 2010-11-03 NOTE — Op Note (Signed)
[name], Patrick Baldwin                   ACCOUNT NO.:  [id number]   MEDICAL RECORD NO.:  [id number]          PATIENT TYPE:  AMB   LOCATION:  SDS                          FACILITY:  [facility]   PHYSICIAN:  Mila Homer. Sherlean Foot, M.D. DATE OF BIRTH:  06/04/1951   DATE OF PROCEDURE:  02/09/2008  DATE OF DISCHARGE:                               OPERATIVE REPORT   SURGEON:  Mila Homer. Sherlean Foot, MD   ASSISTANT:  Altamese Cabal, PA-C   ANESTHESIA:  General.   PREOPERATIVE DIAGNOSIS:  Left hip anterior-superior labral tear.   POSTOPERATIVE DIAGNOSIS:  Left hip anterior-superior labral tear.   PROCEDURE:  Left hip arthroscopy.   INDICATIONS FOR PROCEDURE:  The patient is a 60 year old white male with  MR arthrogram evidence of a labral tear of left hip and mechanical  symptoms.  Informed consent was obtained.   DESCRIPTION OF PROCEDURE:  The patient was laid supine, administered  general anesthesia, and placed in left leg in traction and right leg in  well-padded lithotomy position.  We then prepped and draped the hip in  usual fashion.  I then used the C-arm to percutaneously guide the  anterior portal into the hip joint and then used cannulation to enter  with an arthroscope.  I then created a superolateral portal in similar  fashion for my working portal.  I then had good visualization.  We did  retract the hip prior to incision until we had the vacuum effect.  I  then found degenerative labral tearing anteriorly, had clear view of  that, and debrided with the [employer] shaver.  I then used the  ArthroCare to obtain some hemostasis and further debride the labrum back  to a stable rim.  I then switched the camera to the superolateral portal  and the shaver to the anterior portal and completed the removal of the  anterior labral tear with the [facility] shaver going as far medially  as possible and then placing the ArthroCare in for hemostasis as well.  I got excellent visualization, irrigated and  closed with 4-0 nylon  sutures, dressed with Xeroform dressing, sponges, sterile 4 x 4s, and  Ioban drape.   COMPLICATIONS:  None.   DRAINS:  None.           ______________________________  Mila Homer. Sherlean Foot, M.D.     SDL/MEDQ  D:  02/09/2008  T:  02/09/2008  Job:  [id number]

## 2010-11-03 NOTE — Assessment & Plan Note (Signed)
Doing fair 

## 2010-11-06 NOTE — Assessment & Plan Note (Signed)
[facility]                             PRIMARY CARE OFFICE NOTE   NAME:Patrick Baldwin, Patrick Baldwin                          MRN:          [id number]  DATE:02/15/2006                            DOB:          04-19-1951    SEPARATE EVALUATION AND MANAGEMENT:   REASON FOR VISIT:  We need to address problem today with an episode of  dyspnea, weight gain, COPD, sleep apnea with nasal congestion.   Past medical history, family history, social history, as per January 06, 2004.   MEDICATIONS:  1. Nexium.  2. Xanax.   REVIEW OF SYSTEMS:  As above.  Denies chest pain.  Nasal congestion, unable  to lose weight.  The rest is negative.   PHYSICAL EXAMINATION:  VITAL SIGNS:  Blood pressure 125/77, pulse 73,  temperature 98.2, weight 184 pounds.  GENERAL:  He is in no acute distress.  HEENT:  Moist mucosa.  Swollen nares.  NECK:  Supple, no thyromegaly or bruits.  LUNGS:  Clear.  No rhonchi or rales.  HEART:  Distant tones, no gallops.  ABDOMEN:  Soft, obese, nontender.  No organomegaly.  EXTREMITIES:  Lower extremities without edema.  NEUROLOGIC:  He is alert, oriented, and cooperative.  Denies being  depressed.   LABORATORY DATA:  On January 10, 2006, reviewed.   ASSESSMENT AND PLAN:  1. Episodes of dyspnea, likely related to deconditioning, anxiety, weight      gain, and underlying chronic obstructive pulmonary disease.  He tried      his medications Dr. Jonny Ruiz gave him, virtually there was no relief.  I      asked him to use Spiriva one daily.  2. Obesity, deconditioning, weight gain, discussed.  He will need to lose      weight.  Use smaller portions.  ______________37.5 mg one q.a.m.      Discontinue if problems like chest pain, syncope, leg swelling, etc.  3. Possible chronic obstructive pulmonary disease.  Plan as above.  4. Sleep apnea.  On CPAP.  5. Chronic nasal congestion.  Entex LA one b.i.d. p.r.n.  6. I will see him back in two to three months.                              Sonda Primes, MD   AP/MedQ  DD:  02/16/2006  DT:  02/16/2006  Job #:  [id number]

## 2010-11-06 NOTE — Assessment & Plan Note (Signed)
[facility]                             PRIMARY CARE OFFICE NOTE   Patrick Baldwin, Patrick Baldwin                          MRN:          [id number]  DATE:02/15/2006                            DOB:          05/09/1951    REASON FOR VISIT:  The patient is a 60 year old male who presents for  wellness examination.   Past medical history, family history, social history, as per January 06, 2004,  note.   CURRENT MEDICATIONS:  1. Nexium.  2. Xanax.   ALLERGIES:  CORTISONE caused swelling.   REVIEW OF SYSTEMS:  No chest pain, occasional shortness of breath, including  the episodes of shortness of breath that he saw Dr. Jonny Ruiz for.  Occasional  anxiety.  Complains of weight gain.  Nasal congestion when using a mask or  CPAP.  The rest is negative.   PHYSICAL EXAMINATION:  VITAL SIGNS:  Blood pressure 125/77, pulse 73,  temperature 98.2, weight 284 pounds (was 271).  GENERAL:  He is overweight, he is in no acute distress.  HEENT:  Moist mucosa.  Swelling in the nares.  LUNGS:  Clear.  No wheezes or rales.  HEART:  S1 and S2, distant tones.  No gallops.  NECK:  Supple, no bruit, no thyromegaly.  ABDOMEN:  Soft, nontender, no organomegaly, no masses felt.  EXTREMITIES:  Lower extremities without edema.  SKIN:  Clear.  NEUROLOGIC:  Alert, oriented, and cooperative.  Denies being depressed.  RECTAL:  Slightly enlarged prostate.  No nodules, no masses, stool is guaiac  negative.   LABORATORY DATA:  Cardiolite on January 20, 2006, normal (low risk).  Chest x-  ray normal, January 10, 2006.  CBC normal.  CMET normal.  Glucose 117,  cholesterol 213, LDL 161.  EKG today is normal.  TSH is normal.  PSA 0.92.  Urinalysis normal.   ASSESSMENT AND PLAN:  Normal wellness examination.  Age/health related  issues discussed, healthy lifestyle discussed.  He needs to lose weight,  start exercising.  Repeat exam in 12 months.                                  Sonda Primes, MD   AP/MedQ  DD:  02/16/2006 DT:  02/16/2006 Job #:  [id number]

## 2010-11-19 ENCOUNTER — Other Ambulatory Visit: Payer: Self-pay

## 2010-11-25 ENCOUNTER — Ambulatory Visit: Payer: Self-pay | Admitting: Internal Medicine

## 2011-03-10 ENCOUNTER — Encounter: Payer: Self-pay | Admitting: Internal Medicine

## 2011-03-10 ENCOUNTER — Ambulatory Visit (INDEPENDENT_AMBULATORY_CARE_PROVIDER_SITE_OTHER): Payer: 59 | Admitting: Internal Medicine

## 2011-03-10 ENCOUNTER — Other Ambulatory Visit (INDEPENDENT_AMBULATORY_CARE_PROVIDER_SITE_OTHER): Payer: 59

## 2011-03-10 VITALS — BP 138/82 | HR 80 | Temp 98.3°F | Resp 16 | Wt 274.0 lb

## 2011-03-10 DIAGNOSIS — M7660 Achilles tendinitis, unspecified leg: Secondary | ICD-10-CM

## 2011-03-10 DIAGNOSIS — R739 Hyperglycemia, unspecified: Secondary | ICD-10-CM

## 2011-03-10 DIAGNOSIS — R7309 Other abnormal glucose: Secondary | ICD-10-CM

## 2011-03-10 DIAGNOSIS — R209 Unspecified disturbances of skin sensation: Secondary | ICD-10-CM

## 2011-03-10 DIAGNOSIS — Z23 Encounter for immunization: Secondary | ICD-10-CM

## 2011-03-10 DIAGNOSIS — M67879 Other specified disorders of synovium and tendon, unspecified ankle and foot: Secondary | ICD-10-CM

## 2011-03-10 DIAGNOSIS — R635 Abnormal weight gain: Secondary | ICD-10-CM

## 2011-03-10 DIAGNOSIS — R202 Paresthesia of skin: Secondary | ICD-10-CM | POA: Insufficient documentation

## 2011-03-10 DIAGNOSIS — M6789 Other specified disorders of synovium and tendon, multiple sites: Secondary | ICD-10-CM

## 2011-03-10 DIAGNOSIS — M545 Low back pain: Secondary | ICD-10-CM

## 2011-03-10 DIAGNOSIS — K219 Gastro-esophageal reflux disease without esophagitis: Secondary | ICD-10-CM

## 2011-03-10 LAB — SEDIMENTATION RATE: Sed Rate: 12 mm/hr (ref 0–22)

## 2011-03-10 LAB — COMPREHENSIVE METABOLIC PANEL
ALT: 21 U/L (ref 0–53)
CO2: 27 mEq/L (ref 19–32)
Calcium: 9.1 mg/dL (ref 8.4–10.5)
Chloride: 103 mEq/L (ref 96–112)
GFR: 62.57 mL/min (ref >60.00)
Glucose, Bld: 153 mg/dL — ABNORMAL HIGH (ref 70–99)
Sodium: 139 mEq/L (ref 135–145)
Total Bilirubin: 0.7 mg/dL (ref 0.3–1.2)
Total Protein: 7.8 g/dL (ref 6.0–8.3)

## 2011-03-10 LAB — HEMOGLOBIN A1C: Hgb A1c MFr Bld: 6.4 % (ref 4.6–6.5)

## 2011-03-10 MED ORDER — HYDROCODONE-ACETAMINOPHEN 5-325 MG PO TABS: 90.0000 | ORAL_TABLET | Freq: Three times a day (TID) | ORAL | Status: DC | PRN

## 2011-03-10 NOTE — Patient Instructions (Signed)
Heel lift in L shoe 1/3"

## 2011-03-10 NOTE — Assessment & Plan Note (Signed)
Check B12 

## 2011-03-10 NOTE — Assessment & Plan Note (Signed)
Check labs 

## 2011-03-10 NOTE — Progress Notes (Signed)
  Subjective:    Patient ID: Patrick Baldwin, male    DOB: July 14, 1950, 60 y.o.   MRN: [id number]  HPI   The patient is here to follow up on chronic depression, anxiety, headaches and chronic moderate fibromyalgia symptoms controlled with medicines, diet and exercise. C/o L heel pain x 2 mo ago - hurts a lot  Review of Systems  Constitutional: Negative for appetite change, fatigue and unexpected weight change.  HENT: Negative for nosebleeds, congestion, sore throat, sneezing, trouble swallowing and neck pain.   Eyes: Negative for itching and visual disturbance.  Respiratory: Negative for cough.   Cardiovascular: Negative for chest pain, palpitations and leg swelling.  Gastrointestinal: Negative for nausea, diarrhea, blood in stool and abdominal distention.  Genitourinary: Negative for frequency and hematuria.  Musculoskeletal: Positive for gait problem (L achilles pain). Negative for back pain and joint swelling.  Skin: Negative for rash.  Neurological: Negative for dizziness, tremors, speech difficulty and weakness.  Psychiatric/Behavioral: Negative for sleep disturbance, dysphoric mood and agitation. The patient is not nervous/anxious.    Wt Readings from Last 3 Encounters:  03/10/11 274 lb (124.286 kg)  11/03/10 266 lb (120.657 kg)  05/05/10 267 lb (121.11 kg)       Objective:   Physical Exam  Constitutional: He is oriented to person, place, and time. He appears well-developed.       Obese  HENT:  Mouth/Throat: Oropharynx is clear and moist.  Eyes: Conjunctivae are normal. Pupils are equal, round, and reactive to light.  Neck: Normal range of motion. No JVD present. No thyromegaly present.  Cardiovascular: Normal rate, regular rhythm, normal heart sounds and intact distal pulses.  Exam reveals no gallop and no friction rub.   No murmur heard. Pulmonary/Chest: Effort normal and breath sounds normal. No respiratory distress. He has no wheezes. He has no rales. He exhibits no  tenderness.  Abdominal: Soft. Bowel sounds are normal. He exhibits no distension and no mass. There is no tenderness. There is no rebound and no guarding.  Musculoskeletal: Normal range of motion. He exhibits no edema and no tenderness.  Lymphadenopathy:    He has no cervical adenopathy.  Neurological: He is alert and oriented to person, place, and time. He has normal reflexes. No cranial nerve deficit. He exhibits normal muscle tone. Coordination normal.  Skin: Skin is warm and dry. No rash noted.  Psychiatric: His behavior is normal. Judgment and thought content normal.   L achilles tendon with a thickenned oval midsection knot       Assessment & Plan:

## 2011-03-10 NOTE — Assessment & Plan Note (Addendum)
Procedure Note :    Procedure :   Point of care (POC) sonography examination   Indication: L Achilles tendon mass   Equipment used: Sonosite M-Turbo with HFL38x/13-6 MHz transducer linear probe. The images were stored in the unit and later transferred in storage.  The patient was placed in a decubitus position.  This study revealed a thickened tendon w/o a rupture   Impression: L Achilles tendon tendinosis

## 2011-03-11 ENCOUNTER — Telehealth: Payer: Self-pay | Admitting: Internal Medicine

## 2011-03-11 NOTE — Telephone Encounter (Signed)
Patrick Baldwin, please, inform patient that all labs are normal except for borderline diabetes. It is likely causing foot numbness. Loose 20 lbs, cut back on carbs. We can start him on a medicine called Metformin to normalize sugars. RTC 2 wks.  Pick up lab results and FreeStyle - check CBGs before meals x5 and after meals x5 - bring record  Thx

## 2011-03-11 NOTE — Telephone Encounter (Signed)
Pt informed/transferred to scheduler.  

## 2011-03-11 NOTE — Telephone Encounter (Signed)
Left mess for patient to call back.  

## 2011-03-22 NOTE — Assessment & Plan Note (Signed)
Continue with current prescription therapy as reflected on the Med list.  

## 2011-03-22 NOTE — Assessment & Plan Note (Signed)
Continue with current prescription therapy as reflected on the Med list. Loose wt 

## 2011-03-22 NOTE — Assessment & Plan Note (Signed)
Discussed.

## 2011-03-25 ENCOUNTER — Ambulatory Visit (INDEPENDENT_AMBULATORY_CARE_PROVIDER_SITE_OTHER): Payer: 59 | Admitting: Internal Medicine

## 2011-03-25 ENCOUNTER — Encounter: Payer: Self-pay | Admitting: Internal Medicine

## 2011-03-25 VITALS — BP 100/78 | HR 68 | Temp 98.7°F | Resp 16 | Wt 271.0 lb

## 2011-03-25 DIAGNOSIS — R7309 Other abnormal glucose: Secondary | ICD-10-CM

## 2011-03-25 DIAGNOSIS — R635 Abnormal weight gain: Secondary | ICD-10-CM

## 2011-03-25 DIAGNOSIS — M67879 Other specified disorders of synovium and tendon, unspecified ankle and foot: Secondary | ICD-10-CM

## 2011-03-25 DIAGNOSIS — E119 Type 2 diabetes mellitus without complications: Secondary | ICD-10-CM | POA: Insufficient documentation

## 2011-03-25 DIAGNOSIS — R209 Unspecified disturbances of skin sensation: Secondary | ICD-10-CM

## 2011-03-25 DIAGNOSIS — E1165 Type 2 diabetes mellitus with hyperglycemia: Secondary | ICD-10-CM

## 2011-03-25 DIAGNOSIS — R739 Hyperglycemia, unspecified: Secondary | ICD-10-CM

## 2011-03-25 DIAGNOSIS — M6789 Other specified disorders of synovium and tendon, multiple sites: Secondary | ICD-10-CM

## 2011-03-25 DIAGNOSIS — R202 Paresthesia of skin: Secondary | ICD-10-CM

## 2011-03-25 DIAGNOSIS — IMO0001 Reserved for inherently not codable concepts without codable children: Secondary | ICD-10-CM

## 2011-03-25 MED ORDER — GLUCOSE BLOOD VI STRP: ORAL_STRIP | Status: DC

## 2011-03-25 MED ORDER — METFORMIN HCL 500 MG PO TABS: 500.0000 mg | ORAL_TABLET | Freq: Every day | ORAL | Status: DC

## 2011-03-25 MED ORDER — DICLOFENAC SODIUM 1.5 % TD SOLN: 5.0000 [drp] | Freq: Four times a day (QID) | TRANSDERMAL | Status: DC | PRN

## 2011-03-25 NOTE — Assessment & Plan Note (Signed)
Start Pensaid

## 2011-03-25 NOTE — Progress Notes (Signed)
  Subjective:    Patient ID: Patrick Baldwin, male    DOB: 1951/05/24, 60 y.o.   MRN: [id number]  HPI  The patient presents for a follow-up of   type 2 pre diabetes and feet numbness. CBGs at home 160s pos-prandial. F/u achilles tendon pain.     Review of Systems  Constitutional: Negative for appetite change, fatigue and unexpected weight change.  HENT: Negative for nosebleeds, congestion, sore throat, sneezing, trouble swallowing and neck pain.   Eyes: Negative for itching and visual disturbance.  Respiratory: Negative for cough.   Cardiovascular: Negative for chest pain, palpitations and leg swelling.  Gastrointestinal: Negative for nausea, diarrhea, blood in stool and abdominal distention.  Genitourinary: Negative for frequency and hematuria.  Musculoskeletal: Negative for back pain, joint swelling and gait problem.  Skin: Negative for rash.  Neurological: Negative for dizziness, tremors, speech difficulty and weakness.  Psychiatric/Behavioral: Negative for sleep disturbance, dysphoric mood and agitation. The patient is not nervous/anxious.        Objective:   Physical Exam  Constitutional: He is oriented to person, place, and time. He appears well-developed.       obese  HENT:  Mouth/Throat: Oropharynx is clear and moist.  Eyes: Conjunctivae are normal. Pupils are equal, round, and reactive to light.  Neck: Normal range of motion. No JVD present. No thyromegaly present.  Cardiovascular: Normal rate, regular rhythm, normal heart sounds and intact distal pulses.  Exam reveals no gallop and no friction rub.   No murmur heard. Pulmonary/Chest: Effort normal and breath sounds normal. No respiratory distress. He has no wheezes. He has no rales. He exhibits no tenderness.  Abdominal: Soft. Bowel sounds are normal. He exhibits no distension and no mass. There is no tenderness. There is no rebound and no guarding.  Musculoskeletal: Normal range of motion. He exhibits no edema and no  tenderness.  Lymphadenopathy:    He has no cervical adenopathy.  Neurological: He is alert and oriented to person, place, and time. He has normal reflexes. No cranial nerve deficit. He exhibits normal muscle tone. Coordination normal.  Skin: Skin is warm and dry. No rash noted.  Psychiatric: He has a normal mood and affect. His behavior is normal. Judgment and thought content normal.    Lab Results  Component Value Date   WBC 8.3 04/30/2010   HGB 15.5 04/30/2010   HCT 45.6 04/30/2010   PLT 237.0 04/30/2010   GLUCOSE 153* 03/10/2011   CHOL 197 04/30/2010   TRIG 153.0* 04/30/2010   HDL 37.10* 04/30/2010   LDLCALC 129* 04/30/2010   ALT 21 03/10/2011   AST 19 03/10/2011   NA 139 03/10/2011   K 4.1 03/10/2011   CL 103 03/10/2011   CREATININE 1.3 03/10/2011   BUN 16 03/10/2011   CO2 27 03/10/2011   TSH 1.05 03/10/2011   PSA 0.63 04/30/2010   HGBA1C 6.4 03/10/2011         Assessment & Plan:

## 2011-03-25 NOTE — Assessment & Plan Note (Signed)
Start Metformin 10/12 Labs in 3 mo

## 2011-03-25 NOTE — Assessment & Plan Note (Signed)
Wt Readings from Last 3 Encounters:  03/25/11 271 lb (122.925 kg)  03/10/11 274 lb (124.286 kg)  11/03/10 266 lb (120.657 kg)  Better on diet

## 2011-03-25 NOTE — Assessment & Plan Note (Signed)
Worse in 2012

## 2011-03-25 NOTE — Assessment & Plan Note (Signed)
Will start Meformin

## 2011-06-09 ENCOUNTER — Ambulatory Visit: Payer: 59 | Admitting: Internal Medicine

## 2011-07-02 ENCOUNTER — Ambulatory Visit (INDEPENDENT_AMBULATORY_CARE_PROVIDER_SITE_OTHER): Payer: 59 | Admitting: Internal Medicine

## 2011-07-02 ENCOUNTER — Telehealth: Payer: Self-pay | Admitting: Internal Medicine

## 2011-07-02 ENCOUNTER — Encounter: Payer: Self-pay | Admitting: Internal Medicine

## 2011-07-02 ENCOUNTER — Other Ambulatory Visit (INDEPENDENT_AMBULATORY_CARE_PROVIDER_SITE_OTHER): Payer: 59

## 2011-07-02 VITALS — BP 120/82 | HR 68 | Temp 97.4°F | Resp 16 | Wt 271.0 lb

## 2011-07-02 DIAGNOSIS — E1165 Type 2 diabetes mellitus with hyperglycemia: Secondary | ICD-10-CM

## 2011-07-02 DIAGNOSIS — J069 Acute upper respiratory infection, unspecified: Secondary | ICD-10-CM

## 2011-07-02 DIAGNOSIS — G4733 Obstructive sleep apnea (adult) (pediatric): Secondary | ICD-10-CM | POA: Insufficient documentation

## 2011-07-02 DIAGNOSIS — R5381 Other malaise: Secondary | ICD-10-CM

## 2011-07-02 DIAGNOSIS — R5383 Other fatigue: Secondary | ICD-10-CM

## 2011-07-02 DIAGNOSIS — M545 Low back pain: Secondary | ICD-10-CM

## 2011-07-02 LAB — BASIC METABOLIC PANEL
CO2: 28 mEq/L (ref 19–32)
Calcium: 9.7 mg/dL (ref 8.4–10.5)
Chloride: 105 mEq/L (ref 96–112)
Creatinine, Ser: 1.2 mg/dL (ref 0.4–1.5)
GFR: 65.52 mL/min (ref >60.00)
Glucose, Bld: 143 mg/dL — ABNORMAL HIGH (ref 70–99)
Potassium: 4.6 mEq/L (ref 3.5–5.1)
Sodium: 141 mEq/L (ref 135–145)

## 2011-07-02 LAB — COMPREHENSIVE METABOLIC PANEL
ALT: 28 U/L (ref 0–53)
AST: 22 U/L (ref 0–37)
CO2: 28 mEq/L (ref 19–32)
Calcium: 9.7 mg/dL (ref 8.4–10.5)
Chloride: 105 mEq/L (ref 96–112)
Creatinine, Ser: 1.2 mg/dL (ref 0.4–1.5)
GFR: 65.52 mL/min (ref >60.00)
Sodium: 141 mEq/L (ref 135–145)
Total Protein: 7.4 g/dL (ref 6.0–8.3)

## 2011-07-02 LAB — VITAMIN B12: Vitamin B-12: 722 pg/mL (ref 211–911)

## 2011-07-02 MED ORDER — AMOXICILLIN 500 MG PO CAPS: 1000.0000 mg | ORAL_CAPSULE | Freq: Two times a day (BID) | ORAL | Status: AC

## 2011-07-02 MED ORDER — HYDROCODONE-ACETAMINOPHEN 5-325 MG PO TABS: 90.0000 | ORAL_TABLET | Freq: Three times a day (TID) | ORAL | Status: DC | PRN

## 2011-07-02 NOTE — Assessment & Plan Note (Signed)
Continue with current prescription therapy as reflected on the Med list.  

## 2011-07-02 NOTE — Assessment & Plan Note (Signed)
Check labs 

## 2011-07-02 NOTE — Telephone Encounter (Signed)
Patrick Baldwin, please, inform patient that all labs are normal except for low testost. OV next wk to discuss rx options Thx

## 2011-07-02 NOTE — Progress Notes (Signed)
  Subjective:    Patient ID: Patrick Baldwin, male    DOB: 1951/05/12, 61 y.o.   MRN: [id number]  Cough Associated symptoms include postnasal drip and rhinorrhea. Pertinent negatives include no chest pain, rash or sore throat.  Sinusitis Associated symptoms include coughing and sinus pressure. Pertinent negatives include no congestion, neck pain, sneezing or sore throat.    The patient presents for a follow-up of  Chronic OA, hypertension, chronic dyslipidemia, type 2 diabetes controlled with medicines. C/o URI x 3 wks - green d/c    Review of Systems  Constitutional: Negative for appetite change, fatigue and unexpected weight change.  HENT: Positive for rhinorrhea, postnasal drip and sinus pressure. Negative for nosebleeds, congestion, sore throat, sneezing, trouble swallowing and neck pain.   Eyes: Negative for itching and visual disturbance.  Respiratory: Positive for cough.   Cardiovascular: Negative for chest pain, palpitations and leg swelling.  Gastrointestinal: Negative for nausea, diarrhea, blood in stool and abdominal distention.  Genitourinary: Negative for frequency and hematuria.  Musculoskeletal: Negative for back pain, joint swelling and gait problem.  Skin: Negative for rash.  Neurological: Negative for dizziness, tremors, speech difficulty and weakness.  Psychiatric/Behavioral: Negative for sleep disturbance, dysphoric mood and agitation. The patient is not nervous/anxious.        Objective:   Physical Exam  Constitutional: He is oriented to person, place, and time. He appears well-developed.       obese  HENT:  Mouth/Throat: Oropharynx is clear and moist.       eryth mucosa  Eyes: Conjunctivae are normal. Pupils are equal, round, and reactive to light.  Neck: Normal range of motion. No JVD present. No thyromegaly present.  Cardiovascular: Normal rate, regular rhythm, normal heart sounds and intact distal pulses.  Exam reveals no gallop and no friction rub.   No  murmur heard. Pulmonary/Chest: Effort normal and breath sounds normal. No respiratory distress. He has no wheezes. He has no rales. He exhibits no tenderness.  Abdominal: Soft. Bowel sounds are normal. He exhibits no distension and no mass. There is no tenderness. There is no rebound and no guarding.  Musculoskeletal: Normal range of motion. He exhibits no edema and no tenderness.  Lymphadenopathy:    He has no cervical adenopathy.  Neurological: He is alert and oriented to person, place, and time. He has normal reflexes. No cranial nerve deficit. He exhibits normal muscle tone. Coordination normal.  Skin: Skin is warm and dry. No rash noted.  Psychiatric: He has a normal mood and affect. His behavior is normal. Judgment and thought content normal.          Assessment & Plan:

## 2011-07-02 NOTE — Assessment & Plan Note (Signed)
Refractory  Amoxicillin 500 mg 2 bid

## 2011-07-04 ENCOUNTER — Encounter: Payer: Self-pay | Admitting: Internal Medicine

## 2011-07-05 NOTE — Telephone Encounter (Signed)
Pt informed/transferred to scheduler.  

## 2011-07-09 ENCOUNTER — Ambulatory Visit (INDEPENDENT_AMBULATORY_CARE_PROVIDER_SITE_OTHER): Payer: 59 | Admitting: Internal Medicine

## 2011-07-09 ENCOUNTER — Encounter: Payer: Self-pay | Admitting: Internal Medicine

## 2011-07-09 VITALS — BP 118/82 | HR 60 | Temp 97.4°F | Resp 20 | Wt 269.2 lb

## 2011-07-09 DIAGNOSIS — R5383 Other fatigue: Secondary | ICD-10-CM

## 2011-07-09 DIAGNOSIS — E291 Testicular hypofunction: Secondary | ICD-10-CM

## 2011-07-09 DIAGNOSIS — Z9989 Dependence on other enabling machines and devices: Secondary | ICD-10-CM

## 2011-07-09 DIAGNOSIS — G4733 Obstructive sleep apnea (adult) (pediatric): Secondary | ICD-10-CM

## 2011-07-09 DIAGNOSIS — R5381 Other malaise: Secondary | ICD-10-CM

## 2011-07-09 MED ORDER — TESTOSTERONE 10 MG/ACT (2%) TD GEL: 2.0000 | TRANSDERMAL | Status: DC

## 2011-07-09 NOTE — Progress Notes (Signed)
  Subjective:    Patient ID: Patrick Baldwin, male    DOB: 11-Aug-1950, 61 y.o.   MRN: [id number]  HPI  F/u low testosterone, fatigue, labs  Review of Systems  Constitutional: Positive for activity change and fatigue. Negative for diaphoresis.  HENT: Negative for congestion and neck stiffness.   Eyes: Negative for visual disturbance.  Respiratory: Negative for wheezing.   Gastrointestinal: Negative for blood in stool.  Genitourinary: Negative for decreased urine volume.  Musculoskeletal: Positive for back pain.  Psychiatric/Behavioral: Negative for suicidal ideas. The patient is nervous/anxious.    No CP, no SOB    Objective:   Physical Exam  Constitutional: He appears well-developed. No distress.       Obese   Psychiatric: He has a normal mood and affect. His behavior is normal. Judgment normal.    BP 118/82  Pulse 60  Temp(Src) 97.4 F (36.3 C) (Oral)  Resp 20  Wt 269 lb 4 oz (122.131 kg)  NE Labs reviewed    Assessment & Plan:   Total time over 25 mins > 50% spent counseling patient with regards to the problems above, coordination of care and treatment options.

## 2011-07-09 NOTE — Assessment & Plan Note (Signed)
2012 on CPAP

## 2011-07-09 NOTE — Assessment & Plan Note (Addendum)
1/13 Will try fortesta Potential benefits of a long term testosterone  use as well as potential risks  and complications were explained to the patient and were aknowledged.

## 2011-07-09 NOTE — Assessment & Plan Note (Signed)
Treat OA, hypogonadism, OSA

## 2011-07-30 ENCOUNTER — Other Ambulatory Visit: Payer: Self-pay | Admitting: Internal Medicine

## 2011-08-16 ENCOUNTER — Encounter: Payer: Self-pay | Admitting: Internal Medicine

## 2011-08-16 ENCOUNTER — Ambulatory Visit (INDEPENDENT_AMBULATORY_CARE_PROVIDER_SITE_OTHER): Payer: 59 | Admitting: Internal Medicine

## 2011-08-16 DIAGNOSIS — R5381 Other malaise: Secondary | ICD-10-CM

## 2011-08-16 DIAGNOSIS — H109 Unspecified conjunctivitis: Secondary | ICD-10-CM | POA: Insufficient documentation

## 2011-08-16 DIAGNOSIS — E291 Testicular hypofunction: Secondary | ICD-10-CM

## 2011-08-16 DIAGNOSIS — R5383 Other fatigue: Secondary | ICD-10-CM

## 2011-08-16 DIAGNOSIS — J069 Acute upper respiratory infection, unspecified: Secondary | ICD-10-CM

## 2011-08-16 MED ORDER — PROMETHAZINE-CODEINE 6.25-10 MG/5ML PO SYRP: 5.0000 mL | ORAL_SOLUTION | ORAL | Status: AC | PRN

## 2011-08-16 MED ORDER — ERYTHROMYCIN 5 MG/GM OP OINT: TOPICAL_OINTMENT | Freq: Three times a day (TID) | OPHTHALMIC | Status: AC

## 2011-08-16 MED ORDER — DOXYCYCLINE HYCLATE 100 MG PO TABS: 100.0000 mg | ORAL_TABLET | Freq: Two times a day (BID) | ORAL | Status: AC

## 2011-08-16 NOTE — Patient Instructions (Signed)
Use over-the-counter  "cold" medicines  such as  "Afrin" nasal spray for nasal congestion as directed instead. Use" Delsym" or" Robitussin" cough syrup varietis for cough.  You can use plain "Tylenol" or "Advil" for fever, chills and achyness.  Please, make an appointment if you are not better or if you're worse.  

## 2011-08-16 NOTE — Assessment & Plan Note (Signed)
better 

## 2011-08-16 NOTE — Assessment & Plan Note (Signed)
Doxy Prom-cod 

## 2011-08-16 NOTE — Assessment & Plan Note (Signed)
Continue with current prescription therapy as reflected on the Med list.  

## 2011-08-16 NOTE — Assessment & Plan Note (Signed)
erythro ophth oint bid

## 2011-08-16 NOTE — Progress Notes (Signed)
Patient ID: Patrick Baldwin, male   DOB: Mar 07, 1951, 61 y.o.   MRN: [id number]  Subjective:    Patient ID: Patrick Baldwin, male    DOB: [birth year], 61 y.o.   MRN: [id number]  Eye Pain  Pertinent negatives include no nausea or weakness.  Cough Pertinent negatives include no chest pain, rash or sore throat.    C/o URI x 1 wk C/o R eye - red and painful x 3 d F/u fatigue - better on testosterone    Review of Systems  Constitutional: Negative for appetite change, fatigue and unexpected weight change.  HENT: Negative for nosebleeds, congestion, sore throat, sneezing, trouble swallowing and neck pain.   Eyes: Positive for pain. Negative for itching and visual disturbance.  Respiratory: Positive for cough.   Cardiovascular: Negative for chest pain, palpitations and leg swelling.  Gastrointestinal: Negative for nausea, diarrhea, blood in stool and abdominal distention.  Genitourinary: Negative for frequency and hematuria.  Musculoskeletal: Negative for back pain, joint swelling and gait problem.  Skin: Negative for rash.  Neurological: Negative for dizziness, tremors, speech difficulty and weakness.  Psychiatric/Behavioral: Negative for sleep disturbance, dysphoric mood and agitation. The patient is not nervous/anxious.        Objective:   Physical Exam  Constitutional: He is oriented to person, place, and time. He appears well-developed.       obese  HENT:  Mouth/Throat: Oropharynx is clear and moist.       eryth mucosa  Eyes: EOM are normal. Pupils are equal, round, and reactive to light. Right eye exhibits discharge. Left eye exhibits no discharge. No scleral icterus.       R conj and lower yeylid redness and swelling  Neck: Normal range of motion. No JVD present. No thyromegaly present.  Cardiovascular: Normal rate, regular rhythm, normal heart sounds and intact distal pulses.  Exam reveals no gallop and no friction rub.   No murmur heard. Pulmonary/Chest: Effort normal and breath  sounds normal. No respiratory distress. He has no wheezes. He has no rales. He exhibits no tenderness.  Abdominal: Soft. Bowel sounds are normal. He exhibits no distension and no mass. There is no tenderness. There is no rebound and no guarding.  Musculoskeletal: Normal range of motion. He exhibits no edema and no tenderness.  Lymphadenopathy:    He has no cervical adenopathy.  Neurological: He is alert and oriented to person, place, and time. He has normal reflexes. No cranial nerve deficit. He exhibits normal muscle tone. Coordination normal.  Skin: Skin is warm and dry. No rash noted.  Psychiatric: He has a normal mood and affect. His behavior is normal. Judgment and thought content normal.    Lab Results  Component Value Date   WBC 8.3 04/30/2010   HGB 15.5 04/30/2010   HCT 45.6 04/30/2010   PLT 237.0 04/30/2010   GLUCOSE 143* 07/02/2011   GLUCOSE 143* 07/02/2011   CHOL 197 04/30/2010   TRIG 153.0* 04/30/2010   HDL 37.10* 04/30/2010   LDLCALC 129* 04/30/2010   ALT 28 07/02/2011   AST 22 07/02/2011   NA 141 07/02/2011   NA 141 07/02/2011   K 4.6 07/02/2011   K 4.6 07/02/2011   CL 105 07/02/2011   CL 105 07/02/2011   CREATININE 1.2 07/02/2011   CREATININE 1.2 07/02/2011   BUN 14 07/02/2011   BUN 14 07/02/2011   CO2 28 07/02/2011   CO2 28 07/02/2011   TSH 1.05 03/10/2011   PSA 0.63 04/30/2010   HGBA1C 6.2  07/02/2011         Assessment & Plan:

## 2011-10-06 ENCOUNTER — Ambulatory Visit: Payer: 59 | Admitting: Internal Medicine

## 2011-10-08 ENCOUNTER — Ambulatory Visit: Payer: 59 | Admitting: Internal Medicine

## 2011-10-21 ENCOUNTER — Other Ambulatory Visit (INDEPENDENT_AMBULATORY_CARE_PROVIDER_SITE_OTHER): Payer: 59

## 2011-10-21 ENCOUNTER — Encounter: Payer: Self-pay | Admitting: Internal Medicine

## 2011-10-21 ENCOUNTER — Ambulatory Visit (INDEPENDENT_AMBULATORY_CARE_PROVIDER_SITE_OTHER): Payer: 59 | Admitting: Internal Medicine

## 2011-10-21 VITALS — BP 122/88 | HR 88 | Temp 98.4°F | Resp 16 | Wt 264.0 lb

## 2011-10-21 DIAGNOSIS — M6789 Other specified disorders of synovium and tendon, multiple sites: Secondary | ICD-10-CM

## 2011-10-21 DIAGNOSIS — E1165 Type 2 diabetes mellitus with hyperglycemia: Secondary | ICD-10-CM

## 2011-10-21 DIAGNOSIS — E291 Testicular hypofunction: Secondary | ICD-10-CM

## 2011-10-21 DIAGNOSIS — F329 Major depressive disorder, single episode, unspecified: Secondary | ICD-10-CM

## 2011-10-21 DIAGNOSIS — Z9989 Dependence on other enabling machines and devices: Secondary | ICD-10-CM

## 2011-10-21 DIAGNOSIS — M25552 Pain in left hip: Secondary | ICD-10-CM

## 2011-10-21 DIAGNOSIS — M67879 Other specified disorders of synovium and tendon, unspecified ankle and foot: Secondary | ICD-10-CM

## 2011-10-21 DIAGNOSIS — M25559 Pain in unspecified hip: Secondary | ICD-10-CM

## 2011-10-21 DIAGNOSIS — G4733 Obstructive sleep apnea (adult) (pediatric): Secondary | ICD-10-CM

## 2011-10-21 LAB — CBC WITH DIFFERENTIAL/PLATELET
Basophils Absolute: 0 10*3/uL (ref 0.0–0.1)
Lymphocytes Relative: 14.1 % (ref 12.0–46.0)
Monocytes Relative: 8.4 % (ref 3.0–12.0)
Platelets: 244 10*3/uL (ref 150.0–400.0)
RDW: 13.8 % (ref 11.5–14.6)

## 2011-10-21 LAB — PSA: PSA: 0.81 ng/mL (ref 0.10–4.00)

## 2011-10-21 LAB — TESTOSTERONE: Testosterone: 125.18 ng/dL — ABNORMAL LOW (ref 350.00–890.00)

## 2011-10-21 LAB — HEPATIC FUNCTION PANEL
AST: 25 U/L (ref 0–37)
Albumin: 4.2 g/dL (ref 3.5–5.2)
Alkaline Phosphatase: 48 U/L (ref 39–117)
Total Protein: 7.4 g/dL (ref 6.0–8.3)

## 2011-10-21 NOTE — Assessment & Plan Note (Signed)
1/13; 5/13 - feeling better  Potential benefits of a long term testosterone  use as well as potential risks  and complications were explained to the patient and were aknowledged. Continue with current prescription therapy as reflected on the Med list.

## 2011-10-21 NOTE — Assessment & Plan Note (Signed)
Better  

## 2011-10-21 NOTE — Patient Instructions (Signed)
Hip opener exercises 

## 2011-10-21 NOTE — Assessment & Plan Note (Signed)
5/13   Dr Sherlean Foot -- arthrosc 2009: Clinical Data: 61 year old with left hip pain for 1 year. Question  labral tear.  MRI ARTHROGRAM OF THE LEFT HIP:  Technique: Multiplanar, multisequence MR imaging of the left hip  was performed immediately following contrast injection into the  left hip joint under fluoroscopic guidance. No intravenous  contrast was administered.  Comparison: Left hip radiographs from [facility] 09/05/2007.  Findings: The contrasted fluid is retained within the hip joint  aside from minimal anterior extravasation at the injection site. On  the T2-weighted images, there is a 9 mm paralabral cyst lateral to  the left superior acetabular labrum. This does not contain  Gadolinium on the T1-weighted images. No well-defined tear of the  adjacent acetabular labrum is demonstrated. There is degenerative  signal anteriorly in the labrum on the sagittal images. The left  femoral head appears normal without evidence of focal chondral  defect or osteonecrosis. There are small osteophytes of the left  femoral neck.  The coronal images demonstrate no right hip joint effusion or  abnormality the right femoral head. There are small cysts within  the right superior acetabulum posteriorly.  No periarticular fluid collections are identified. The visualized  bony pelvis otherwise appears normal. The internal pelvic contents  appear normal.  IMPRESSION:  1. Small paralabral cyst laterally on the left is compatible with  a small occult acetabular labral tear. No well-defined tear is  demonstrated.  2. Mild degeneration of the left superior acetabular labrum  anteriorly.  3. Mild degenerative subchondral cystic change in the right  superior acetabulum posteriorly.  4. No acute osseous findings.  Provider: Fabiola Backer, Argie Ramming, Luberta Mutter

## 2011-10-21 NOTE — Assessment & Plan Note (Signed)
Continue with current prescription therapy as reflected on the Med list.  

## 2011-10-22 ENCOUNTER — Telehealth: Payer: Self-pay | Admitting: Internal Medicine

## 2011-10-22 DIAGNOSIS — N529 Male erectile dysfunction, unspecified: Secondary | ICD-10-CM

## 2011-10-22 MED ORDER — TESTOSTERONE 10 MG/ACT (2%) TD GEL: 3.0000 | TRANSDERMAL | Status: DC

## 2011-10-22 NOTE — Telephone Encounter (Signed)
Patrick Baldwin, please, inform patient that all labs are normal except for low testost OK to use 3 pumps of the gel daily and call in 90 g Rx Keep ROV w/testost check Thx

## 2011-10-22 NOTE — Telephone Encounter (Signed)
Notified pt and he voices understanding. Future lab order placed prior to appt in August. Rx called to [facility].

## 2011-10-24 NOTE — Progress Notes (Signed)
  Subjective:    Patient ID: Patrick Baldwin, male    DOB: [birth year], 61 y.o.   MRN: [id number]  Hip Pain     F/u low testosterone - doing much better overall, fatigue - better, labs. C/o L hip pain x 1 mo in the groin area  BP Readings from Last 3 Encounters:  10/21/11 122/88  08/16/11 128/70  07/09/11 118/82   Wt Readings from Last 3 Encounters:  10/21/11 264 lb (119.75 kg)  08/16/11 272 lb (123.378 kg)  07/09/11 269 lb 4 oz (122.131 kg)      Review of Systems  Constitutional: Negative for diaphoresis, activity change and fatigue.  HENT: Negative for congestion and neck stiffness.   Eyes: Negative for visual disturbance.  Respiratory: Negative for wheezing.   Gastrointestinal: Negative for blood in stool.  Genitourinary: Negative for decreased urine volume.  Musculoskeletal: Positive for back pain.  Psychiatric/Behavioral: Negative for suicidal ideas. The patient is not nervous/anxious.    No CP, no SOB    Objective:   Physical Exam  Constitutional: He is oriented to person, place, and time. He appears well-developed. No distress.       Obese   HENT:  Mouth/Throat: Oropharynx is clear and moist.  Eyes: Conjunctivae are normal. Pupils are equal, round, and reactive to light.  Neck: Normal range of motion. No JVD present. No thyromegaly present.  Cardiovascular: Normal rate, regular rhythm, normal heart sounds and intact distal pulses.  Exam reveals no gallop and no friction rub.   No murmur heard. Pulmonary/Chest: Effort normal and breath sounds normal. No respiratory distress. He has no wheezes. He has no rales. He exhibits no tenderness.  Abdominal: Soft. Bowel sounds are normal. He exhibits no distension and no mass. There is no tenderness. There is no rebound and no guarding.  Musculoskeletal: Normal range of motion. He exhibits no edema and no tenderness.  Lymphadenopathy:    He has no cervical adenopathy.  Neurological: He is alert and oriented to person, place,  and time. He has normal reflexes. No cranial nerve deficit. He exhibits normal muscle tone. Coordination normal.  Skin: Skin is warm and dry. No rash noted.  Psychiatric: He has a normal mood and affect. His behavior is normal. Judgment and thought content normal.    BP 122/88  Pulse 88  Temp(Src) 98.4 F (36.9 C) (Oral)  Resp 16  Wt 264 lb (119.75 kg)      Assessment & Plan:

## 2011-10-29 ENCOUNTER — Telehealth: Payer: Self-pay | Admitting: *Deleted

## 2011-10-29 ENCOUNTER — Other Ambulatory Visit: Payer: Self-pay | Admitting: Internal Medicine

## 2011-10-29 NOTE — Telephone Encounter (Signed)
Requested Medications     HYDROcodone-acetaminophen (NORCO) 5-325 MG per tablet [Pharmacy Med Name: HYDROCODON-ACETAMINOPHEN 5-325]   take 1 tablet by mouth every 8 hours if needed for pain   Disp: 90 tablet R: 2 Start: 10/29/2011  Class: Normal   Requested on: 07/02/2011   Originally ordered on: 09/29/2010  Last refill: 09/30/2011

## 2011-10-29 NOTE — Telephone Encounter (Signed)
OK to fill this prescription with additional refills x1 Thank you!  

## 2011-11-01 MED ORDER — HYDROCODONE-ACETAMINOPHEN 5-325 MG PO TABS
90.0000 | ORAL_TABLET | Freq: Three times a day (TID) | ORAL | Status: DC | PRN
Start: 2011-11-01 — End: 2011-12-30

## 2011-11-01 NOTE — Telephone Encounter (Signed)
Done

## 2011-11-29 ENCOUNTER — Other Ambulatory Visit: Payer: Self-pay | Admitting: Internal Medicine

## 2011-12-30 ENCOUNTER — Other Ambulatory Visit: Payer: Self-pay | Admitting: Internal Medicine

## 2011-12-30 NOTE — Telephone Encounter (Signed)
Ok to RF? 

## 2012-01-12 ENCOUNTER — Other Ambulatory Visit (HOSPITAL_COMMUNITY): Payer: Self-pay | Admitting: Urology

## 2012-01-12 DIAGNOSIS — M25552 Pain in left hip: Secondary | ICD-10-CM

## 2012-01-17 ENCOUNTER — Other Ambulatory Visit (HOSPITAL_COMMUNITY): Payer: 59

## 2012-01-18 ENCOUNTER — Other Ambulatory Visit (INDEPENDENT_AMBULATORY_CARE_PROVIDER_SITE_OTHER): Payer: 59

## 2012-01-18 DIAGNOSIS — N529 Male erectile dysfunction, unspecified: Secondary | ICD-10-CM

## 2012-01-21 ENCOUNTER — Ambulatory Visit (INDEPENDENT_AMBULATORY_CARE_PROVIDER_SITE_OTHER): Payer: 59 | Admitting: Internal Medicine

## 2012-01-21 ENCOUNTER — Encounter: Payer: Self-pay | Admitting: Internal Medicine

## 2012-01-21 VITALS — BP 120/70 | HR 76 | Temp 98.7°F | Resp 16 | Wt 264.0 lb

## 2012-01-21 DIAGNOSIS — M25559 Pain in unspecified hip: Secondary | ICD-10-CM

## 2012-01-21 DIAGNOSIS — E1165 Type 2 diabetes mellitus with hyperglycemia: Secondary | ICD-10-CM

## 2012-01-21 DIAGNOSIS — F329 Major depressive disorder, single episode, unspecified: Secondary | ICD-10-CM

## 2012-01-21 DIAGNOSIS — M545 Low back pain: Secondary | ICD-10-CM

## 2012-01-21 DIAGNOSIS — G4733 Obstructive sleep apnea (adult) (pediatric): Secondary | ICD-10-CM

## 2012-01-21 DIAGNOSIS — E291 Testicular hypofunction: Secondary | ICD-10-CM

## 2012-01-21 DIAGNOSIS — M25552 Pain in left hip: Secondary | ICD-10-CM

## 2012-01-21 DIAGNOSIS — Z9989 Dependence on other enabling machines and devices: Secondary | ICD-10-CM

## 2012-01-21 MED ORDER — KETOPROFEN ER 200 MG PO CP24: 200.0000 mg | ORAL_CAPSULE | Freq: Every day | ORAL | Status: DC

## 2012-01-21 MED ORDER — HYDROCODONE-ACETAMINOPHEN 5-325 MG PO TABS: 1.0000 | ORAL_TABLET | Freq: Three times a day (TID) | ORAL | Status: DC | PRN

## 2012-01-21 MED ORDER — SILDENAFIL CITRATE 100 MG PO TABS: 100.0000 mg | ORAL_TABLET | Freq: Every day | ORAL | Status: DC | PRN

## 2012-01-21 MED ORDER — ASPIRIN 325 MG PO TABS: 325.0000 mg | ORAL_TABLET | Freq: Every day | ORAL | Status: DC

## 2012-01-21 NOTE — Assessment & Plan Note (Signed)
Continue with current prescription therapy as reflected on the Med list.  

## 2012-01-21 NOTE — Assessment & Plan Note (Signed)
Continue with current prescription therapy as reflected on the Med list. Better 

## 2012-01-21 NOTE — Progress Notes (Signed)
Patient ID: Patrick Baldwin, male   DOB: 01-Sep-1950, 61 y.o.   MRN: [id number]  Subjective:    Patient ID: Patrick Baldwin, male    DOB: 1951/05/18, 61 y.o.   MRN: [id number]  Hip Pain     F/u low testosterone - doing much better overall, fatigue - better, labs.   C/o L hip pain x 3 mo in the groin area - severe OA - he was rec a THR - he will see [name]; he will have a hip inj at [facility]  BP Readings from Last 3 Encounters:  01/21/12 120/70  10/21/11 122/88  08/16/11 128/70   Wt Readings from Last 3 Encounters:  01/21/12 264 lb (119.75 kg)  10/21/11 264 lb (119.75 kg)  08/16/11 272 lb (123.378 kg)      Review of Systems  Constitutional: Negative for diaphoresis, activity change and fatigue.  HENT: Negative for congestion and neck stiffness.   Eyes: Negative for visual disturbance.  Respiratory: Negative for wheezing.   Gastrointestinal: Negative for blood in stool.  Genitourinary: Negative for decreased urine volume.  Musculoskeletal: Positive for back pain, arthralgias and gait problem.       L hip severe pain and locking  Psychiatric/Behavioral: Negative for suicidal ideas. The patient is not nervous/anxious.    No CP, no SOB    Objective:   Physical Exam  Constitutional: He is oriented to person, place, and time. He appears well-developed. No distress.       Obese   HENT:  Mouth/Throat: Oropharynx is clear and moist.  Eyes: Conjunctivae are normal. Pupils are equal, round, and reactive to light.  Neck: Normal range of motion. No JVD present. No thyromegaly present.  Cardiovascular: Normal rate, regular rhythm, normal heart sounds and intact distal pulses.  Exam reveals no gallop and no friction rub.   No murmur heard. Pulmonary/Chest: Effort normal and breath sounds normal. No respiratory distress. He has no wheezes. He has no rales. He exhibits no tenderness.  Abdominal: Soft. Bowel sounds are normal. He exhibits no distension and no mass. There is no tenderness. There is no  rebound and no guarding.  Musculoskeletal: Normal range of motion. He exhibits tenderness (L hip w/ROM). He exhibits no edema.  Lymphadenopathy:    He has no cervical adenopathy.  Neurological: He is alert and oriented to person, place, and time. He has normal reflexes. No cranial nerve deficit. He exhibits normal muscle tone. Coordination normal.  Skin: Skin is warm and dry. No rash noted.  Psychiatric: He has a normal mood and affect. His behavior is normal. Judgment and thought content normal.    BP 120/70  Pulse 76  Temp 98.7 F (37.1 C) (Oral)  Resp 16  Wt 264 lb (119.75 kg)      Assessment & Plan:

## 2012-01-21 NOTE — Assessment & Plan Note (Signed)
THR was recommended

## 2012-01-21 NOTE — Assessment & Plan Note (Signed)
Cont with CPAP

## 2012-01-27 ENCOUNTER — Ambulatory Visit (HOSPITAL_COMMUNITY)
Admission: RE | Admit: 2012-01-27 | Discharge: 2012-01-27 | Disposition: A | Discharge status: Home / Self Care | Payer: 59 | Source: Ambulatory Visit | Attending: Urology | Admitting: Urology

## 2012-01-27 DIAGNOSIS — M25552 Pain in left hip: Secondary | ICD-10-CM

## 2012-01-27 DIAGNOSIS — M25559 Pain in unspecified hip: Secondary | ICD-10-CM | POA: Insufficient documentation

## 2012-01-27 MED ORDER — TRIAMCINOLONE ACETONIDE 40 MG/ML IJ SUSP (RADIOLOGY)
40.0000 mg | Freq: Once | INTRAMUSCULAR | Status: AC
  Administered 2012-01-27: 2.5 mg via INTRA_ARTICULAR

## 2012-01-27 MED ORDER — IOHEXOL 180 MG/ML  SOLN
20.0000 mL | Freq: Once | INTRAMUSCULAR | Status: AC | PRN
  Administered 2012-01-27: 3 mL via INTRA_ARTICULAR

## 2012-02-27 ENCOUNTER — Other Ambulatory Visit: Payer: Self-pay | Admitting: Orthopedic Surgery

## 2012-02-27 MED ORDER — BUPIVACAINE LIPOSOME 1.3 % IJ SUSP: 20.0000 mL | Freq: Once | INTRAMUSCULAR | Status: DC

## 2012-02-27 NOTE — Progress Notes (Signed)
Preoperative surgical orders have been place into the Epic hospital system for Patrick Baldwin on 02/27/2012, 12:23 PM  by Patrica Duel for surgery on 05/22/2012.  Preop Total Hip orders including Experel Injecion, IV Tylenol as long as there are no contraindications to the above medications. Avel Peace, PA-C

## 2012-03-06 ENCOUNTER — Telehealth: Payer: Self-pay | Admitting: *Deleted

## 2012-03-06 NOTE — Telephone Encounter (Signed)
Pt wants to know if letter for surgical clearance for upcoming surgery has been sent to surgeon. Pt states that surgeon has called him to let him know that he needs letter for surgical clearance-please advise.

## 2012-03-06 NOTE — Telephone Encounter (Signed)
OK - he is clear for surgery Thx

## 2012-03-08 NOTE — Telephone Encounter (Signed)
Pt informed that I faxed clearance form yesterday.

## 2012-03-13 ENCOUNTER — Other Ambulatory Visit: Payer: Self-pay | Admitting: Orthopedic Surgery

## 2012-03-22 ENCOUNTER — Ambulatory Visit (INDEPENDENT_AMBULATORY_CARE_PROVIDER_SITE_OTHER): Payer: 59

## 2012-03-22 DIAGNOSIS — Z23 Encounter for immunization: Secondary | ICD-10-CM

## 2012-03-30 ENCOUNTER — Other Ambulatory Visit: Payer: Self-pay | Admitting: Internal Medicine

## 2012-04-10 ENCOUNTER — Encounter (HOSPITAL_COMMUNITY): Payer: Self-pay | Admitting: Pharmacy Technician

## 2012-04-14 ENCOUNTER — Encounter (HOSPITAL_COMMUNITY): Payer: Self-pay

## 2012-04-14 ENCOUNTER — Encounter (HOSPITAL_COMMUNITY)
Admission: RE | Admit: 2012-04-14 | Discharge: 2012-04-14 | Disposition: A | Discharge status: Home / Self Care | Payer: 59 | Source: Ambulatory Visit | Attending: Orthopedic Surgery | Admitting: Orthopedic Surgery

## 2012-04-14 ENCOUNTER — Ambulatory Visit (HOSPITAL_COMMUNITY)
Admission: RE | Admit: 2012-04-14 | Discharge: 2012-04-14 | Disposition: A | Discharge status: Home / Self Care | Payer: 59 | Source: Ambulatory Visit | Attending: Orthopedic Surgery | Admitting: Orthopedic Surgery

## 2012-04-14 DIAGNOSIS — Z01818 Encounter for other preprocedural examination: Secondary | ICD-10-CM | POA: Insufficient documentation

## 2012-04-14 DIAGNOSIS — M1610 Unilateral primary osteoarthritis, unspecified hip: Secondary | ICD-10-CM | POA: Insufficient documentation

## 2012-04-14 DIAGNOSIS — M169 Osteoarthritis of hip, unspecified: Secondary | ICD-10-CM | POA: Insufficient documentation

## 2012-04-14 HISTORY — DX: Unspecified osteoarthritis, unspecified site: M19.90

## 2012-04-14 LAB — URINALYSIS, ROUTINE W REFLEX MICROSCOPIC
Glucose, UA: NEGATIVE mg/dL
Leukocytes, UA: NEGATIVE
Protein, ur: NEGATIVE mg/dL
pH: 6 (ref 5.0–8.0)

## 2012-04-14 LAB — CBC
MCH: 29.4 pg (ref 26.0–34.0)
MCHC: 33.2 g/dL (ref 30.0–36.0)
Platelets: 272 10*3/uL (ref 150–400)
RBC: 5.51 MIL/uL (ref 4.22–5.81)

## 2012-04-14 LAB — COMPREHENSIVE METABOLIC PANEL
ALT: 28 U/L (ref 0–53)
AST: 22 U/L (ref 0–37)
Albumin: 4.1 g/dL (ref 3.5–5.2)
Calcium: 9.6 mg/dL (ref 8.4–10.5)
Sodium: 140 mEq/L (ref 135–145)
Total Protein: 7.4 g/dL (ref 6.0–8.3)

## 2012-04-14 NOTE — Patient Instructions (Signed)
20      Your procedure is scheduled on:  Wednesday 04/19/2012 at 145 pm  Report to [facility] at 1115 AM.  Call this number if you have problems the morning of surgery: [phone]   Remember:   Do not eat food after midnight! FROM MIDNIGHT UP UNTIL 0745 AM ,YOU MAY HAVE CLEAR LIQUIDS THEN NOTHING AFTER 0745 AM UNTIL AFTER SURGERY!  Take these medicines the morning of surgery with A SIP OF WATER: Nexium   Do not bring valuables to the hospital.  .  Leave suitcase in the car. After surgery it may be brought to your room.  For patients admitted to the hospital, checkout time is 11:00 AM the day of              Discharge.    Special Instructions: See [facility] Preparing  For Surgery Instruction Sheet. Do not wear jewelry, lotions powders, perfumes. Women do not shave  legs or underarms for 12 hours before showers. Contacts, partial plates, or dentures may not be worn into surgery.                          Patients discharged the day of surgery will not be allowed to drive home. If going home the same day of surgery, must have someone stay with you first 24 hrs.at home and arrange for someone to drive you home from the  Hospital.               Please read over the following fact sheets that you were given: MRSA              INFORMATION, Blood Transfusion sheet, Incentive Spirometry sheet               Telford Nab.Deriyah Kunath,RN,BSN [id number]

## 2012-04-14 NOTE — Progress Notes (Signed)
Medical clearance on chart from Dr. Posey Rea 03-06-2012.

## 2012-04-18 ENCOUNTER — Other Ambulatory Visit: Payer: Self-pay | Admitting: Orthopedic Surgery

## 2012-04-18 NOTE — H&P (Signed)
Patrick Baldwin  DOB: 1951/05/08 Married / Language: English / Race: White Male  Date of Admission:  04/19/2012  Chief Complaint:  Left Hip Pain  History of Present Illness The patient is a 61 year old male who comes in for a preoperative History and Physical. The patient is scheduled for a left total hip arthroplasty - Anterior Approach to be performed by Dr. Gus Baldwin. [name], MD at [facility] on 04/19/12. The patient is a 61 year old male who presents with a hip problem. The patient was seen for a second opinion.The patient reports left hip problems including pain symptoms that have been present for 6 month(s). The symptoms began without any known injury. Symptoms reported include hip pain, catching and difficulty ambulating (more walking is painful) The patient reports symptoms radiating to the: left groin and left thigh. The patient describes the hip problem as dull and aching. Symptoms are exacerbated by squatting, walking and lying on the affected side. Symptoms are relieved by opioid analgesics (hydrocodone). Symptoms present at the patient's previous evaluation included pain in the hip. Previous workup for this problem has included hip x-rays. Previous treatment for this problem has included opioid analgesics. Patient was treated by Dr. Valentina Baldwin prior to this appointment. He was sent to Dr. Caswell Baldwin in [facility] by Dr. Valentina Baldwin who told him that he has bone on bone arthritis in his left hip. He was then sent here. Dr. Valentina Baldwin initially saw him in 2009 for a labral cyst. He had a hip scope and had relief of his symptoms. About 6 months ago he started having pain in the left groin and thigh with no known injury. The left hip is now hurting him at all times. It is limiting what he can and cannot do. He had the scope a few years ago and did well initially. Now he is getting progressively worse. Dr. Caswell Baldwin felt hip replacement was in order. He is ready to proceed with total hip  surgery. They have been treated conservatively in the past for the above stated problem and despite conservative measures, they continue to have progressive pain and severe functional limitations and dysfunction. They have failed non-operative management including home exercise, medications. It is felt that they would benefit from undergoing total joint replacement. Risks and benefits of the procedure have been discussed with the patient and they elect to proceed with surgery. There are no active contraindications to surgery such as ongoing infection or rapidly progressive neurological disease.  Problem List Osteoarthritis, Hip (715.35)   Allergies No Known Drug Allergies. 02/24/2012   Family History Depression. mother Diabetes Mellitus. father Congestive Heart Failure. father Cerebrovascular Accident. mother Osteoarthritis. mother   Social History Exercise. Exercises daily; does other and gym / weights Illicit drug use. no Drug/Alcohol Rehab (Previously). no Current work status. retired Financial planner (Currently). no Living situation. live with spouse Tobacco use. former smoker; smoke(d) 1/2 pack(s) per day; uses 1/2 can(s) smokeless per week Tobacco / smoke exposure. no Marital status. married Pain Contract. no Alcohol use. former drinker Children. 2   Medication History NexIUM (40MG  Capsule DR, Oral) Active. MetFORMIN HCl (500MG  Tablet, Oral) Active. Ketoprofen ER (200MG  Capsule ER 24HR, Oral) Active. Vitamin B Complex (1 (one) Oral) Specific dose unknown - Active. Vitamin C (1 (one) Oral) Specific dose unknown - Active. Fortesta (10 MG/ACT(2%) Gel, Transdermal) Active.   Past Surgical History Right Knee Surgery Right Hip Surgery   Medical History Sleep Apnea Gastroesophageal Reflux Disease Non-Insulin Dependent Diabetes Mellitus  Review of Systems General:Not Present- Chills, Fever, Night Sweats, Fatigue, Weight Gain, Weight  Loss and Memory Loss. Skin:Not Present- Hives, Itching, Rash, Eczema and Lesions. HEENT:Not Present- Tinnitus, Headache, Double Vision, Visual Loss, Hearing Loss and Dentures. Respiratory:Not Present- Shortness of breath with exertion, Shortness of breath at rest, Allergies, Coughing up blood and Chronic Cough. Cardiovascular:Not Present- Chest Pain, Racing/skipping heartbeats, Difficulty Breathing Lying Down, Murmur, Swelling and Palpitations. Gastrointestinal:Present- Constipation. Not Present- Bloody Stool, Heartburn, Abdominal Pain, Vomiting, Nausea, Diarrhea, Difficulty Swallowing, Jaundice and Loss of appetitie. Male Genitourinary:Not Present- Urinary frequency, Blood in Urine, Weak urinary stream, Discharge, Flank Pain, Incontinence, Painful Urination, Urgency, Urinary Retention and Urinating at Night. Musculoskeletal:Present- Muscle Pain and Joint Pain. Not Present- Muscle Weakness, Joint Swelling, Back Pain, Morning Stiffness and Spasms. Neurological:Not Present- Tremor, Dizziness, Blackout spells, Paralysis, Difficulty with balance and Weakness. Psychiatric:Not Present- Insomnia.   Vitals Weight: 260 lb Height: 70 in Body Surface Area: 2.41 m Body Mass Index: 37.31 kg/m Pulse: 64 (Regular) Resp.: 12 (Unlabored) BP: 144/78 (Sitting, Right Arm, Standard)    Physical Exam The physical exam findings are as follows:   General Mental Status - Alert, cooperative and good historian. General Appearance- pleasant. Not in acute distress. Orientation- Oriented X3. Build & Nutrition- Well nourished and Well developed.   Note: Patient is a 61 year old male with continued hip pain.  Head and Neck Head- normocephalic, atraumatic . Neck Global Assessment- supple. no bruit auscultated on the right and no bruit auscultated on the left.   Eye Pupil- Bilateral- Regular and Round. Motion- Bilateral- EOMI.   Chest and Lung  Exam Auscultation: Breath sounds:- clear at anterior chest wall and - clear at posterior chest wall. Adventitious sounds:- No Adventitious sounds.   Cardiovascular Auscultation:Rhythm- Regular rate and rhythm. Heart Sounds- S1 WNL and S2 WNL. Murmurs & Other Heart Sounds:Auscultation of the heart reveals - No Murmurs.   Abdomen Inspection:Contour- Generalized moderate distention. Palpation/Percussion:Tenderness- Abdomen is non-tender to palpation. Rigidity (guarding)- Abdomen is soft. Auscultation:Auscultation of the abdomen reveals - Bowel sounds normal.   Male Genitourinary Not done, not pertinent to present illness  Musculoskeletal On exam, the patient is a well developed and well nourished male alert and oriented in no apparent distress. His right hip has normal range of motion with no discomfort. Left hip flexion 90. Minimal internal rotation. About 20 external rotation and 20 abduction. Knee exam is normal. Pulse, sensation and motor intact. Antalgic gait pattern on the left.  RADIOGRAPHS: Radiographs from June are checked. He does have joint space narrowing on the left but not bone on bone. Those are supine radiographs. We obtained a standing AP pelvis today and he in deed has severe bone on bone changes of the left hip with some flattening of the femoral head.  Assessment & Plan Osteoarthritis, Hip (715.35) Impression: Left Hip  Note: Patient is for a Left Total Hip - Anterior Approach by Dr. Lequita Halt.  Plan is to go home.  PCP - Dr. Beaulah Dinning - Patient has been seen preoperatively and felt to be stable for surgery.  Signed electronically by Roberts Gaudy, PA-C

## 2012-04-19 ENCOUNTER — Ambulatory Visit (HOSPITAL_COMMUNITY): Payer: 59

## 2012-04-19 ENCOUNTER — Inpatient Hospital Stay (HOSPITAL_COMMUNITY)
Admission: RE | Admit: 2012-04-19 | Discharge: 2012-04-21 | DRG: 470 | Disposition: A | Discharge status: Home Health | Payer: 59 | Source: Ambulatory Visit | Attending: Orthopedic Surgery | Admitting: Orthopedic Surgery

## 2012-04-19 ENCOUNTER — Encounter (HOSPITAL_COMMUNITY): Payer: Self-pay | Admitting: Anesthesiology

## 2012-04-19 ENCOUNTER — Encounter (HOSPITAL_COMMUNITY): Payer: Self-pay | Admitting: *Deleted

## 2012-04-19 ENCOUNTER — Encounter (HOSPITAL_COMMUNITY)
Admission: RE | Disposition: A | Discharge status: Home Health | Payer: Self-pay | Source: Ambulatory Visit | Attending: Orthopedic Surgery

## 2012-04-19 ENCOUNTER — Ambulatory Visit (HOSPITAL_COMMUNITY): Payer: 59 | Admitting: Anesthesiology

## 2012-04-19 DIAGNOSIS — D62 Acute posthemorrhagic anemia: Secondary | ICD-10-CM | POA: Diagnosis not present

## 2012-04-19 DIAGNOSIS — E119 Type 2 diabetes mellitus without complications: Secondary | ICD-10-CM | POA: Diagnosis present

## 2012-04-19 DIAGNOSIS — G4733 Obstructive sleep apnea (adult) (pediatric): Secondary | ICD-10-CM | POA: Diagnosis present

## 2012-04-19 DIAGNOSIS — F411 Generalized anxiety disorder: Secondary | ICD-10-CM | POA: Diagnosis present

## 2012-04-19 DIAGNOSIS — E871 Hypo-osmolality and hyponatremia: Secondary | ICD-10-CM

## 2012-04-19 DIAGNOSIS — M1610 Unilateral primary osteoarthritis, unspecified hip: Principal | ICD-10-CM | POA: Diagnosis present

## 2012-04-19 DIAGNOSIS — F329 Major depressive disorder, single episode, unspecified: Secondary | ICD-10-CM | POA: Diagnosis present

## 2012-04-19 DIAGNOSIS — Z87891 Personal history of nicotine dependence: Secondary | ICD-10-CM

## 2012-04-19 DIAGNOSIS — F3289 Other specified depressive episodes: Secondary | ICD-10-CM | POA: Diagnosis present

## 2012-04-19 DIAGNOSIS — K219 Gastro-esophageal reflux disease without esophagitis: Secondary | ICD-10-CM | POA: Diagnosis present

## 2012-04-19 DIAGNOSIS — M169 Osteoarthritis of hip, unspecified: Principal | ICD-10-CM | POA: Diagnosis present

## 2012-04-19 DIAGNOSIS — Z96649 Presence of unspecified artificial hip joint: Secondary | ICD-10-CM

## 2012-04-19 DIAGNOSIS — I252 Old myocardial infarction: Secondary | ICD-10-CM

## 2012-04-19 DIAGNOSIS — E669 Obesity, unspecified: Secondary | ICD-10-CM | POA: Diagnosis present

## 2012-04-19 HISTORY — PX: TOTAL HIP ARTHROPLASTY: SHX124

## 2012-04-19 LAB — TYPE AND SCREEN: ABO/RH(D): O POS

## 2012-04-19 LAB — GLUCOSE, CAPILLARY
Glucose-Capillary: 125 mg/dL — ABNORMAL HIGH (ref 70–99)
Glucose-Capillary: 109 mg/dL — ABNORMAL HIGH (ref 70–99)
Glucose-Capillary: 144 mg/dL — ABNORMAL HIGH (ref 70–99)

## 2012-04-19 SURGERY — ARTHROPLASTY, HIP, TOTAL, ANTERIOR APPROACH
Anesthesia: Monitor Anesthesia Care | Site: Hip | Laterality: Left | Wound class: Clean

## 2012-04-19 MED ORDER — ONDANSETRON HCL 4 MG PO TABS: 4.0000 mg | ORAL_TABLET | Freq: Four times a day (QID) | ORAL | Status: DC | PRN

## 2012-04-19 MED ORDER — ACETAMINOPHEN 10 MG/ML IV SOLN: 1000.0000 mg | Freq: Once | INTRAVENOUS | Status: DC

## 2012-04-19 MED ORDER — CEFAZOLIN SODIUM-DEXTROSE 2-3 GM-% IV SOLR
2.0000 g | Freq: Four times a day (QID) | INTRAVENOUS | Status: AC
  Administered 2012-04-19 – 2012-04-20 (×2): 2 g via INTRAVENOUS
  Filled 2012-04-19 (×2): qty 50

## 2012-04-19 MED ORDER — MIDAZOLAM HCL 5 MG/5ML IJ SOLN
INTRAMUSCULAR | Status: DC | PRN
  Administered 2012-04-19: 2 mg via INTRAVENOUS

## 2012-04-19 MED ORDER — HYDROMORPHONE HCL PF 1 MG/ML IJ SOLN
INTRAMUSCULAR | Status: AC
  Administered 2012-04-19: 1 mg
  Filled 2012-04-19: qty 1

## 2012-04-19 MED ORDER — METHOCARBAMOL 100 MG/ML IJ SOLN
500.0000 mg | Freq: Four times a day (QID) | INTRAVENOUS | Status: DC | PRN
  Administered 2012-04-20: 500 mg via INTRAVENOUS
  Filled 2012-04-19 (×2): qty 5

## 2012-04-19 MED ORDER — ACETAMINOPHEN 10 MG/ML IV SOLN
1000.0000 mg | Freq: Four times a day (QID) | INTRAVENOUS | Status: AC
  Administered 2012-04-19 – 2012-04-20 (×4): 1000 mg via INTRAVENOUS
  Filled 2012-04-19 (×6): qty 100

## 2012-04-19 MED ORDER — PROPOFOL INFUSION 10 MG/ML OPTIME
INTRAVENOUS | Status: DC | PRN
  Administered 2012-04-19: 75 ug/kg/min via INTRAVENOUS

## 2012-04-19 MED ORDER — 0.9 % SODIUM CHLORIDE (POUR BTL) OPTIME
TOPICAL | Status: DC | PRN
  Administered 2012-04-19: 1000 mL

## 2012-04-19 MED ORDER — FENTANYL CITRATE 0.05 MG/ML IJ SOLN
INTRAMUSCULAR | Status: DC | PRN
  Administered 2012-04-19: 25 ug via INTRAVENOUS
  Administered 2012-04-19: 50 ug via INTRAVENOUS

## 2012-04-19 MED ORDER — METFORMIN HCL 500 MG PO TABS
500.0000 mg | ORAL_TABLET | Freq: Every day | ORAL | Status: DC
  Administered 2012-04-20: 500 mg via ORAL
  Filled 2012-04-19 (×2): qty 1

## 2012-04-19 MED ORDER — ACETAMINOPHEN 10 MG/ML IV SOLN
INTRAVENOUS | Status: DC | PRN
  Administered 2012-04-19: 1000 mg via INTRAVENOUS

## 2012-04-19 MED ORDER — BUPIVACAINE HCL (PF) 0.5 % IJ SOLN
INTRAMUSCULAR | Status: DC | PRN
  Administered 2012-04-19: 3 mL

## 2012-04-19 MED ORDER — DEXAMETHASONE SODIUM PHOSPHATE 10 MG/ML IJ SOLN
INTRAMUSCULAR | Status: DC | PRN
  Administered 2012-04-19: 10 mg via INTRAVENOUS

## 2012-04-19 MED ORDER — OXYCODONE HCL 5 MG PO TABS
5.0000 mg | ORAL_TABLET | ORAL | Status: DC | PRN
  Administered 2012-04-19 – 2012-04-21 (×12): 10 mg via ORAL
  Filled 2012-04-19 (×14): qty 2

## 2012-04-19 MED ORDER — METOCLOPRAMIDE HCL 5 MG/ML IJ SOLN: 5.0000 mg | Freq: Three times a day (TID) | INTRAMUSCULAR | Status: DC | PRN

## 2012-04-19 MED ORDER — ACETAMINOPHEN 650 MG RE SUPP: 650.0000 mg | Freq: Four times a day (QID) | RECTAL | Status: DC | PRN

## 2012-04-19 MED ORDER — BUPIVACAINE HCL (PF) 0.5 % IJ SOLN
INTRAMUSCULAR | Status: AC
  Filled 2012-04-19: qty 30

## 2012-04-19 MED ORDER — RIVAROXABAN 10 MG PO TABS
10.0000 mg | ORAL_TABLET | Freq: Every day | ORAL | Status: DC
  Administered 2012-04-20 – 2012-04-21 (×2): 10 mg via ORAL
  Filled 2012-04-19 (×3): qty 1

## 2012-04-19 MED ORDER — LACTATED RINGERS IV SOLN
INTRAVENOUS | Status: DC
  Administered 2012-04-19: 1000 mL via INTRAVENOUS
  Administered 2012-04-19 (×3): via INTRAVENOUS

## 2012-04-19 MED ORDER — DOCUSATE SODIUM 100 MG PO CAPS
100.0000 mg | ORAL_CAPSULE | Freq: Two times a day (BID) | ORAL | Status: DC
  Administered 2012-04-19 – 2012-04-21 (×4): 100 mg via ORAL

## 2012-04-19 MED ORDER — MORPHINE SULFATE 2 MG/ML IJ SOLN
1.0000 mg | INTRAMUSCULAR | Status: DC | PRN
  Administered 2012-04-19 (×2): 2 mg via INTRAVENOUS
  Administered 2012-04-19: 1 mg via INTRAVENOUS
  Administered 2012-04-19: 2 mg via INTRAVENOUS
  Filled 2012-04-19 (×4): qty 1

## 2012-04-19 MED ORDER — PHENOL 1.4 % MT LIQD: 1.0000 | OROMUCOSAL | Status: DC | PRN

## 2012-04-19 MED ORDER — DIPHENHYDRAMINE HCL 12.5 MG/5ML PO ELIX: 12.5000 mg | ORAL_SOLUTION | ORAL | Status: DC | PRN

## 2012-04-19 MED ORDER — DEXTROSE 5 % IV SOLN
3.0000 g | INTRAVENOUS | Status: AC
  Administered 2012-04-19: 2 g via INTRAVENOUS
  Filled 2012-04-19: qty 3000

## 2012-04-19 MED ORDER — SODIUM CHLORIDE 0.9 % IV SOLN: INTRAVENOUS | Status: DC

## 2012-04-19 MED ORDER — SODIUM CHLORIDE 0.9 % IJ SOLN
INTRAMUSCULAR | Status: DC | PRN
  Administered 2012-04-19: 50 mL

## 2012-04-19 MED ORDER — PANTOPRAZOLE SODIUM 40 MG PO TBEC
80.0000 mg | DELAYED_RELEASE_TABLET | Freq: Every day | ORAL | Status: DC
  Filled 2012-04-19: qty 2

## 2012-04-19 MED ORDER — MENTHOL 3 MG MT LOZG: 1.0000 | LOZENGE | OROMUCOSAL | Status: DC | PRN

## 2012-04-19 MED ORDER — ACETAMINOPHEN 325 MG PO TABS
650.0000 mg | ORAL_TABLET | Freq: Four times a day (QID) | ORAL | Status: DC | PRN
  Administered 2012-04-21: 650 mg via ORAL
  Filled 2012-04-19: qty 2

## 2012-04-19 MED ORDER — CHLORHEXIDINE GLUCONATE 4 % EX LIQD
60.0000 mL | Freq: Once | CUTANEOUS | Status: DC
  Filled 2012-04-19: qty 60

## 2012-04-19 MED ORDER — BISACODYL 10 MG RE SUPP: 10.0000 mg | Freq: Every day | RECTAL | Status: DC | PRN

## 2012-04-19 MED ORDER — SODIUM CHLORIDE 0.9 % IV SOLN
INTRAVENOUS | Status: DC
  Administered 2012-04-19 – 2012-04-20 (×2): via INTRAVENOUS
  Administered 2012-04-20: 10 mL/h via INTRAVENOUS

## 2012-04-19 MED ORDER — TRAMADOL HCL 50 MG PO TABS: 50.0000 mg | ORAL_TABLET | Freq: Four times a day (QID) | ORAL | Status: DC | PRN

## 2012-04-19 MED ORDER — BUPIVACAINE LIPOSOME 1.3 % IJ SUSP
20.0000 mL | INTRAMUSCULAR | Status: AC
  Administered 2012-04-19: 20 mL
  Filled 2012-04-19: qty 20

## 2012-04-19 MED ORDER — TEMAZEPAM 15 MG PO CAPS
15.0000 mg | ORAL_CAPSULE | Freq: Every evening | ORAL | Status: DC | PRN
Start: 2012-04-19 — End: 2012-04-21

## 2012-04-19 MED ORDER — CEFAZOLIN SODIUM-DEXTROSE 2-3 GM-% IV SOLR
INTRAVENOUS | Status: AC
  Filled 2012-04-19: qty 50

## 2012-04-19 MED ORDER — ACETAMINOPHEN 10 MG/ML IV SOLN
INTRAVENOUS | Status: AC
  Filled 2012-04-19: qty 100

## 2012-04-19 MED ORDER — ONDANSETRON HCL 4 MG/2ML IJ SOLN: 4.0000 mg | Freq: Four times a day (QID) | INTRAMUSCULAR | Status: DC | PRN

## 2012-04-19 MED ORDER — POLYETHYLENE GLYCOL 3350 17 G PO PACK: 17.0000 g | PACK | Freq: Every day | ORAL | Status: DC | PRN

## 2012-04-19 MED ORDER — INSULIN ASPART 100 UNIT/ML ~~LOC~~ SOLN
0.0000 [IU] | Freq: Three times a day (TID) | SUBCUTANEOUS | Status: DC
  Administered 2012-04-20 (×2): 2 [IU] via SUBCUTANEOUS
  Administered 2012-04-21: 3 [IU] via SUBCUTANEOUS
  Administered 2012-04-21: 2 [IU] via SUBCUTANEOUS

## 2012-04-19 MED ORDER — PHENYLEPHRINE HCL 10 MG/ML IJ SOLN
INTRAMUSCULAR | Status: DC | PRN
  Administered 2012-04-19 (×3): 40 ug via INTRAVENOUS

## 2012-04-19 MED ORDER — METHOCARBAMOL 500 MG PO TABS
500.0000 mg | ORAL_TABLET | Freq: Four times a day (QID) | ORAL | Status: DC | PRN
  Administered 2012-04-19 – 2012-04-21 (×6): 500 mg via ORAL
  Filled 2012-04-19 (×6): qty 1

## 2012-04-19 MED ORDER — FLEET ENEMA 7-19 GM/118ML RE ENEM: 1.0000 | ENEMA | Freq: Once | RECTAL | Status: AC | PRN

## 2012-04-19 MED ORDER — ONDANSETRON HCL 4 MG/2ML IJ SOLN
INTRAMUSCULAR | Status: DC | PRN
  Administered 2012-04-19: 4 mg via INTRAVENOUS

## 2012-04-19 MED ORDER — METOCLOPRAMIDE HCL 10 MG PO TABS: 5.0000 mg | ORAL_TABLET | Freq: Three times a day (TID) | ORAL | Status: DC | PRN

## 2012-04-19 SURGICAL SUPPLY — 43 items
BAG ZIPLOCK 12X15 (MISCELLANEOUS) ×4 IMPLANT
BLADE SAW SGTL 18X1.27X75 (BLADE) ×2 IMPLANT
CELLS DAT CNTRL 66122 CELL SVR (MISCELLANEOUS) IMPLANT
CLOTH BEACON ORANGE TIMEOUT ST (SAFETY) ×2 IMPLANT
CLSR STERI-STRIP ANTIMIC 1/2X4 (GAUZE/BANDAGES/DRESSINGS) ×2 IMPLANT
DECANTER SPIKE VIAL GLASS SM (MISCELLANEOUS) ×2 IMPLANT
DRAPE C-ARM 42X72 X-RAY (DRAPES) ×2 IMPLANT
DRAPE STERI IOBAN 125X83 (DRAPES) ×2 IMPLANT
DRAPE U-SHAPE 47X51 STRL (DRAPES) ×6 IMPLANT
DRSG ADAPTIC 3X8 NADH LF (GAUZE/BANDAGES/DRESSINGS) ×2 IMPLANT
DRSG MEPILEX BORDER 4X4 (GAUZE/BANDAGES/DRESSINGS) ×2 IMPLANT
DRSG MEPILEX BORDER 4X8 (GAUZE/BANDAGES/DRESSINGS) ×2 IMPLANT
DURAPREP 26ML APPLICATOR (WOUND CARE) ×2 IMPLANT
ELECT NEEDLE EXT 1 X 6-1/2 (ELECTRODE) ×2 IMPLANT
ELECT REM PT RETURN 9FT ADLT (ELECTROSURGICAL) ×2
ELECTRODE REM PT RTRN 9FT ADLT (ELECTROSURGICAL) ×1 IMPLANT
EVACUATOR 1/8 PVC DRAIN (DRAIN) IMPLANT
FACESHIELD LNG OPTICON STERILE (SAFETY) ×8 IMPLANT
GLOVE BIO SURGEON STRL SZ8 (GLOVE) ×2 IMPLANT
GLOVE BIOGEL PI IND STRL 8 (GLOVE) ×2 IMPLANT
GLOVE BIOGEL PI INDICATOR 8 (GLOVE) ×2
GLOVE ECLIPSE 8.0 STRL XLNG CF (GLOVE) ×2 IMPLANT
GOWN STRL NON-REIN LRG LVL3 (GOWN DISPOSABLE) ×2 IMPLANT
GOWN STRL REIN XL XLG (GOWN DISPOSABLE) ×4 IMPLANT
KIT BASIN OR (CUSTOM PROCEDURE TRAY) ×2 IMPLANT
NDL SAFETY ECLIPSE 18X1.5 (NEEDLE) ×1 IMPLANT
NEEDLE HYPO 18GX1.5 SHARP (NEEDLE) ×1
PACK TOTAL JOINT (CUSTOM PROCEDURE TRAY) ×2 IMPLANT
PADDING CAST COTTON 6X4 STRL (CAST SUPPLIES) ×2 IMPLANT
RTRCTR WOUND ALEXIS 18CM MED (MISCELLANEOUS)
SPONGE GAUZE 4X4 12PLY (GAUZE/BANDAGES/DRESSINGS) ×2 IMPLANT
STRIP CLOSURE SKIN 1/2X4 (GAUZE/BANDAGES/DRESSINGS) ×2 IMPLANT
SUCTION FRAZIER 12FR DISP (SUCTIONS) ×2 IMPLANT
SUT ETHIBOND NAB CT1 #1 30IN (SUTURE) ×6 IMPLANT
SUT MNCRL AB 4-0 PS2 18 (SUTURE) ×2 IMPLANT
SUT VIC AB 1 CT1 27 (SUTURE) ×2
SUT VIC AB 1 CT1 27XBRD ANTBC (SUTURE) ×2 IMPLANT
SUT VIC AB 2-0 CT1 27 (SUTURE) ×2
SUT VIC AB 2-0 CT1 TAPERPNT 27 (SUTURE) ×2 IMPLANT
SUT VLOC 180 0 24IN GS25 (SUTURE) ×2 IMPLANT
SYR 50ML LL SCALE MARK (SYRINGE) ×2 IMPLANT
TOWEL OR 17X26 10 PK STRL BLUE (TOWEL DISPOSABLE) ×4 IMPLANT
TRAY FOLEY CATH 14FRSI W/METER (CATHETERS) ×2 IMPLANT

## 2012-04-19 NOTE — H&P (View-Only) (Signed)
Patrick Baldwin  DOB: 11/11/1950 Married / Language: English / Race: White Male  Date of Admission:  04/19/2012  Chief Complaint:  Left Hip Pain  History of Present Illness The patient is a 61 year old male who comes in for a preoperative History and Physical. The patient is scheduled for a left total hip arthroplasty - Anterior Approach to be performed by [name], MD at [facility] on 04/19/12. The patient is a 61 year old male who presents with a hip problem. The patient was seen for a second opinion.The patient reports left hip problems including pain symptoms that have been present for 6 month(s). The symptoms began without any known injury. Symptoms reported include hip pain, catching and difficulty ambulating (more walking is painful) The patient reports symptoms radiating to the: left groin and left thigh. The patient describes the hip problem as dull and aching. Symptoms are exacerbated by squatting, walking and lying on the affected side. Symptoms are relieved by opioid analgesics (hydrocodone). Symptoms present at the patient's previous evaluation included pain in the hip. Previous workup for this problem has included hip x-rays. Previous treatment for this problem has included opioid analgesics. Patient was treated by [name] prior to this appointment. He was sent to [name] in [place] by [name] who told him that he has bone on bone arthritis in his left hip. He was then sent here. [name] initially saw him in 2009 for a labral cyst. He had a hip scope and had relief of his symptoms. About 6 months ago he started having pain in the left groin and thigh with no known injury. The left hip is now hurting him at all times. It is limiting what he can and cannot do. He had the scope a few years ago and did well initially. Now he is getting progressively worse. [name] felt hip replacement was in order. He is ready to proceed with total hip  surgery. They have been treated conservatively in the past for the above stated problem and despite conservative measures, they continue to have progressive pain and severe functional limitations and dysfunction. They have failed non-operative management including home exercise, medications. It is felt that they would benefit from undergoing total joint replacement. Risks and benefits of the procedure have been discussed with the patient and they elect to proceed with surgery. There are no active contraindications to surgery such as ongoing infection or rapidly progressive neurological disease.  Problem List Osteoarthritis, Hip (715.35)   Allergies No Known Drug Allergies. 02/24/2012   Family History Depression. mother Diabetes Mellitus. father Congestive Heart Failure. father Cerebrovascular Accident. mother Osteoarthritis. mother   Social History Exercise. Exercises daily; does other and gym / weights Illicit drug use. no Drug/Alcohol Rehab (Previously). no Current work status. retired Drug/Alcohol Rehab (Currently). no Living situation. live with spouse Tobacco use. former smoker; smoke(d) 1/2 pack(s) per day; uses 1/2 can(s) smokeless per week Tobacco / smoke exposure. no Marital status. married Pain Contract. no Alcohol use. former drinker Children. 2   Medication History NexIUM (40MG Capsule DR, Oral) Active. MetFORMIN HCl (500MG Tablet, Oral) Active. Ketoprofen ER (200MG Capsule ER 24HR, Oral) Active. Vitamin B Complex (1 (one) Oral) Specific dose unknown - Active. Vitamin C (1 (one) Oral) Specific dose unknown - Active. Fortesta (10 MG/ACT(2%) Gel, Transdermal) Active.   Past Surgical History Right Knee Surgery Right Hip Surgery   Medical History Sleep Apnea Gastroesophageal Reflux Disease Non-Insulin Dependent Diabetes Mellitus     Review of Systems General:Not Present- Chills, Fever, Night Sweats, Fatigue, Weight Gain, Weight  Loss and Memory Loss. Skin:Not Present- Hives, Itching, Rash, Eczema and Lesions. HEENT:Not Present- Tinnitus, Headache, Double Vision, Visual Loss, Hearing Loss and Dentures. Respiratory:Not Present- Shortness of breath with exertion, Shortness of breath at rest, Allergies, Coughing up blood and Chronic Cough. Cardiovascular:Not Present- Chest Pain, Racing/skipping heartbeats, Difficulty Breathing Lying Down, Murmur, Swelling and Palpitations. Gastrointestinal:Present- Constipation. Not Present- Bloody Stool, Heartburn, Abdominal Pain, Vomiting, Nausea, Diarrhea, Difficulty Swallowing, Jaundice and Loss of appetitie. Male Genitourinary:Not Present- Urinary frequency, Blood in Urine, Weak urinary stream, Discharge, Flank Pain, Incontinence, Painful Urination, Urgency, Urinary Retention and Urinating at Night. Musculoskeletal:Present- Muscle Pain and Joint Pain. Not Present- Muscle Weakness, Joint Swelling, Back Pain, Morning Stiffness and Spasms. Neurological:Not Present- Tremor, Dizziness, Blackout spells, Paralysis, Difficulty with balance and Weakness. Psychiatric:Not Present- Insomnia.   Vitals Weight: 260 lb Height: 70 in Body Surface Area: 2.41 m Body Mass Index: 37.31 kg/m Pulse: 64 (Regular) Resp.: 12 (Unlabored) BP: 144/78 (Sitting, Right Arm, Standard)    Physical Exam The physical exam findings are as follows:   General Mental Status - Alert, cooperative and good historian. General Appearance- pleasant. Not in acute distress. Orientation- Oriented X3. Build & Nutrition- Well nourished and Well developed.   Note: Patient is a 61 year old male with continued hip pain.  Head and Neck Head- normocephalic, atraumatic . Neck Global Assessment- supple. no bruit auscultated on the right and no bruit auscultated on the left.   Eye Pupil- Bilateral- Regular and Round. Motion- Bilateral- EOMI.   Chest and Lung  Exam Auscultation: Breath sounds:- clear at anterior chest wall and - clear at posterior chest wall. Adventitious sounds:- No Adventitious sounds.   Cardiovascular Auscultation:Rhythm- Regular rate and rhythm. Heart Sounds- S1 WNL and S2 WNL. Murmurs & Other Heart Sounds:Auscultation of the heart reveals - No Murmurs.   Abdomen Inspection:Contour- Generalized moderate distention. Palpation/Percussion:Tenderness- Abdomen is non-tender to palpation. Rigidity (guarding)- Abdomen is soft. Auscultation:Auscultation of the abdomen reveals - Bowel sounds normal.   Male Genitourinary Not done, not pertinent to present illness  Musculoskeletal On exam, the patient is a well developed and well nourished male alert and oriented in no apparent distress. His right hip has normal range of motion with no discomfort. Left hip flexion 90. Minimal internal rotation. About 20 external rotation and 20 abduction. Knee exam is normal. Pulse, sensation and motor intact. Antalgic gait pattern on the left.  RADIOGRAPHS: Radiographs from June are checked. He does have joint space narrowing on the left but not bone on bone. Those are supine radiographs. We obtained a standing AP pelvis today and he in deed has severe bone on bone changes of the left hip with some flattening of the femoral head.  Assessment & Plan Osteoarthritis, Hip (715.35) Impression: Left Hip  Note: Patient is for a Left Total Hip - Anterior Approach by Dr. [name].  Plan is to go home.  PCP - Dr. A. Plotnikov - Patient has been seen preoperatively and felt to be stable for surgery.  Signed electronically by DREW L PERKINS, PA-C 

## 2012-04-19 NOTE — Anesthesia Postprocedure Evaluation (Signed)
Anesthesia Post Note  Patient: Patrick Baldwin  Procedure(s) Performed: Procedure(s) (LRB): TOTAL HIP ARTHROPLASTY ANTERIOR APPROACH (Left)  Anesthesia type: Spinal  Patient location: PACU  Post pain: Pain level controlled  Post assessment: Post-op Vital signs reviewed  Last Vitals: BP 105/67  Pulse 64  Temp 36.6 C  Resp 13  SpO2 100%  Post vital signs: Reviewed  Level of consciousness: sedated  Complications: No apparent anesthesia complications

## 2012-04-19 NOTE — Anesthesia Preprocedure Evaluation (Addendum)
Anesthesia Evaluation  Patient identified by MRN, date of birth, ID band Patient awake    Reviewed: Allergy & Precautions, H&P , NPO status , Patient's Chart, lab work & pertinent test results  Airway Mallampati: II TM Distance: >3 FB Neck ROM: Full    Dental  (+) Dental Advisory Given and Teeth Intact   Pulmonary sleep apnea and Continuous Positive Airway Pressure Ventilation , former smoker,  breath sounds clear to auscultation- rhonchi  Pulmonary exam normal       Cardiovascular - CAD and - Past MI Rhythm:Regular Rate:Normal     Neuro/Psych PSYCHIATRIC DISORDERS Anxiety Depression negative neurological ROS     GI/Hepatic Neg liver ROS, GERD-  Medicated,  Endo/Other  diabetes, Type 2, Oral Hypoglycemic Agents  Renal/GU negative Renal ROS     Musculoskeletal negative musculoskeletal ROS (+)   Abdominal (+) + obese,   Peds  Hematology negative hematology ROS (+)   Anesthesia Other Findings   Reproductive/Obstetrics                        Anesthesia Physical Anesthesia Plan  ASA: III  Anesthesia Plan: Spinal and MAC   Post-op Pain Management:    Induction:   Airway Management Planned: Simple Face Mask  Additional Equipment:   Intra-op Plan:   Post-operative Plan:   Informed Consent: I have reviewed the patients History and Physical, chart, labs and discussed the procedure including the risks, benefits and alternatives for the proposed anesthesia with the patient or authorized representative who has indicated his/her understanding and acceptance.   Dental advisory given  Plan Discussed with: CRNA  Anesthesia Plan Comments:         Anesthesia Quick Evaluation

## 2012-04-19 NOTE — Anesthesia Procedure Notes (Signed)
Spinal  Patient location during procedure: OR Start time: 04/19/2012 12:30 PM End time: 04/19/2012 12:35 PM Staffing CRNA/Resident: Paris Lore Performed by: resident/CRNA  Preanesthetic Checklist Completed: patient identified, site marked, surgical consent, pre-op evaluation, timeout performed, IV checked, risks and benefits discussed and monitors and equipment checked Spinal Block Patient position: sitting Prep: ChloraPrep Patient monitoring: heart rate, continuous pulse ox and blood pressure Location: L2-3 Injection technique: single-shot Needle Needle type: Sprotte  Needle gauge: 24 G Needle length: 9 cm Assessment Sensory level: T4 Additional Notes Expiration date of kit checked and confirmed. Patient tolerated procedure well, without complications. X1 attempt with noted clear CSF return neg. Hemo., noted T4 level on exam pre incision.

## 2012-04-19 NOTE — Transfer of Care (Signed)
Immediate Anesthesia Transfer of Care Note  Patient: Patrick Baldwin  Procedure(s) Performed: Procedure(s) (LRB) with comments: TOTAL HIP ARTHROPLASTY ANTERIOR APPROACH (Left)  Patient Location: PACU  Anesthesia Type:MAC and Spinal  Level of Consciousness: awake, alert , patient cooperative and responds to stimulation  Airway & Oxygen Therapy: Patient Spontanous Breathing and Patient connected to face mask oxygen  Post-op Assessment: Report given to PACU RN and Post -op Vital signs reviewed and stable  Post vital signs: Reviewed and stable, spinal L@  Complications: No apparent anesthesia complications

## 2012-04-19 NOTE — Brief Op Note (Signed)
04/19/2012  3:08 PM  PATIENT:  Patrick Baldwin  61 y.o. male  PRE-OPERATIVE DIAGNOSIS:  Osteoarthritis of the Left Hip  POST-OPERATIVE DIAGNOSIS:  Osteoarthritis of the Left Hip  PROCEDURE:  Procedure(s) (LRB) with comments: TOTAL HIP ARTHROPLASTY ANTERIOR APPROACH (Left)  SURGEON:  Surgeon(s) and Role:    * Loanne Drilling, MD - Primary  PHYSICIAN ASSISTANT:   ASSISTANTS: Leilani Able, PA-C   ANESTHESIA:   spinal  EBL:  Total I/O In: 2000 [I.V.:2000] Out: 1250 [Urine:250; Blood:1000]  BLOOD ADMINISTERED:none  DRAINS: (Medium) Hemovact drain(s) in the left hip with  Suction Open   LOCAL MEDICATIONS USED:  OTHER Exparel 20 ml mixed with 50 ml saline  COUNTS:  YES  TOURNIQUET:  * No tourniquets in log *  DICTATION: .Other Dictation: Dictation Number [id number]  PLAN OF CARE: Admit to inpatient   PATIENT DISPOSITION:  PACU - hemodynamically stable.

## 2012-04-19 NOTE — Interval H&P Note (Signed)
History and Physical Interval Note:  04/19/2012 11:54 AM  Patrick Baldwin  has presented today for surgery, with the diagnosis of Osteoarthritis of the Left Hip  The various methods of treatment have been discussed with the patient and family. After consideration of risks, benefits and other options for treatment, the patient has consented to  Procedure(s) (LRB) with comments: TOTAL HIP ARTHROPLASTY ANTERIOR APPROACH (Left) as a surgical intervention .  The patient's history has been reviewed, patient examined, no change in status, stable for surgery.  I have reviewed the patient's chart and labs.  Questions were answered to the patient's satisfaction.     Loanne Drilling

## 2012-04-20 ENCOUNTER — Encounter (HOSPITAL_COMMUNITY): Payer: Self-pay | Admitting: Orthopedic Surgery

## 2012-04-20 DIAGNOSIS — E871 Hypo-osmolality and hyponatremia: Secondary | ICD-10-CM

## 2012-04-20 DIAGNOSIS — D62 Acute posthemorrhagic anemia: Secondary | ICD-10-CM

## 2012-04-20 LAB — CBC
MCV: 87.8 fL (ref 78.0–100.0)
Platelets: 235 10*3/uL (ref 150–400)
RBC: 4.43 MIL/uL (ref 4.22–5.81)
RDW: 13.2 % (ref 11.5–15.5)
WBC: 16.7 10*3/uL — ABNORMAL HIGH (ref 4.0–10.5)

## 2012-04-20 LAB — BASIC METABOLIC PANEL
CO2: 26 mEq/L (ref 19–32)
Chloride: 98 mEq/L (ref 96–112)
Creatinine, Ser: 1.07 mg/dL (ref 0.50–1.35)
GFR calc Af Amer: 85 mL/min — ABNORMAL LOW (ref >90)
Sodium: 134 mEq/L — ABNORMAL LOW (ref 135–145)

## 2012-04-20 LAB — GLUCOSE, CAPILLARY: Glucose-Capillary: 144 mg/dL — ABNORMAL HIGH (ref 70–99)

## 2012-04-20 MED ORDER — KETOROLAC TROMETHAMINE 30 MG/ML IJ SOLN
30.0000 mg | Freq: Four times a day (QID) | INTRAMUSCULAR | Status: DC | PRN
  Administered 2012-04-20: 30 mg via INTRAVENOUS
  Filled 2012-04-20: qty 1

## 2012-04-20 MED ORDER — NON FORMULARY: 40.0000 mg | Freq: Every day | Status: DC

## 2012-04-20 MED ORDER — ESOMEPRAZOLE MAGNESIUM 40 MG PO CPDR
40.0000 mg | DELAYED_RELEASE_CAPSULE | Freq: Every day | ORAL | Status: DC
  Administered 2012-04-20 – 2012-04-21 (×2): 40 mg via ORAL
  Filled 2012-04-20 (×2): qty 1

## 2012-04-20 MED ORDER — HYDROMORPHONE HCL PF 1 MG/ML IJ SOLN
1.0000 mg | INTRAMUSCULAR | Status: DC | PRN
  Administered 2012-04-20: 1 mg via INTRAVENOUS
  Filled 2012-04-20: qty 1

## 2012-04-20 NOTE — Op Note (Signed)
NAMEKLINE, BULTHUIS                   ACCOUNT NO.:  [id number]  MEDICAL RECORD NO.:  [id number]  LOCATION:  1621                         FACILITY:  [facility]  PHYSICIAN:  Ollen Gross, M.D.    DATE OF BIRTH:  1950/07/25  DATE OF PROCEDURE:  04/19/2012 DATE OF DISCHARGE:                              OPERATIVE REPORT   PREOPERATIVE DIAGNOSIS:  Osteoarthritis of the left hip.  POSTOPERATIVE DIAGNOSIS:  Osteoarthritis of the left hip.  PROCEDURE:  Left total hip arthroplasty, anterior approach.  SURGEON:  Ollen Gross, MD  ASSISTANT:  Jaquelyn Bitter. Chabon, PA  ANESTHESIA:  Spinal.  ESTIMATED BLOOD LOSS:  900.  DRAINS:  Hemovac x1.  COMPLICATIONS:  None.  CONDITION:  Stable to recovery.  BRIEF CLINICAL NOTE:  Patrick Baldwin is a 61 year old male who has advanced end- stage arthritis of his left hip with progressively worsening pain and dysfunction.  He has failed nonoperative management and presents for total hip arthroplasty.  PROCEDURE IN DETAIL:  After successful administration of spinal anesthetic, the traction boots for the [facility] bed were placed on both feet and he was placed onto the [place]illi[place] bed, boots placed into the leg holders.  The left hip was then isolated from his perineum with plastic drapes and prepped and draped in the usual sterile fashion.  ASIS and greater trochanter were marked and a oblique incision was made, starting at about 1 cm lateral and 2 cm distal to the ASIS and coursing towards the anterior cortex of the femur.  The skin was cut with a 10 blade through subcutaneous tissue to the level of the fascia overlying the tensor fascia lata muscle.  The fascia was then incised in line with the incision at the junction of the anterior third and posterior 2/3rd.  The muscle was teased off the fascia and then the interval between the TFL and the rectus was developed.  The Hohmann retractor was then placed at the top of the femoral neck over the capsule.  The vessels  overlying the capsule were cauterized and the fat on top of the capsule was removed. A Hohmann retractor was then placed anterior underneath the rectus femoris to give exposure to the entire anterior capsule.  A T-shaped capsulotomy was performed.  The edges were tagged and the femoral head was identified.  Osteophytes are removed off the superior acetabulum. The femoral neck was then cut in situ with an oscillating saw.  Traction was then applied to the left lower extremity utilizing the [facility] traction.  The femoral head was then removed.  Retractors were placed around the acetabulum and then circumferential removal of the labrum was performed.  Osteophytes were also removed.  Reaming starts at 45 mm to medialize and course of it increased to the 55 mm.  We reamed in approximately 40 degrees of abduction, 20 degrees anteversion.  A 56 mm pinnacle acetabular shell was then impacted in anatomic position under fluoroscopic guidance with excellent purchase.  We did not need to place any additional dome screws.  A 36 mm neutral +4 marathon liner was then placed into the acetabular shell.  The femoral lift was then placed along the lateral aspect of  the femur just to the vastus ridge and the femur was gently lifted and placed into the external traction.  The leg was then externally rotated and capsule was stripped off the inferior aspect of the femoral neck down to the level of the lesser trochanter, this was done with electrocautery.  The leg was then placed and extended in adducted position to essentially deliver the femur.  We also removed the capsule superiorly and the piriformis from the piriformis fossa to gain excellent exposure of the proximal femur.  Rongeur was used to remove some cancellous bone to get into the lateral portion of the proximal femur for placement of the initial starter reamer.  The starter broaches was placed.  It felt as though it was going to the medial and  posterior and x-ray showed that it had exited the canal.  We re-did the starter broach in more anatomic position and was shown to go down the center of the canal.  Broaching __________ Corail system was then performed starting at size 8, coursing __________ size 12.  A size 12 had excellent torsional and rotational and axial stability.  The trial standard offset neck was then placed with a 36+ 1.5 trial head.  The hip was then reduced.  We confirmed that the stem was in both on AP and lateral x-rays.  It is confirmed to be in the canal.  It also has excellent sizing.  The hip was reduced with outstanding stability through full extension, full external rotation, and then flexion in adduction internal rotation.  AP pelvis was taken and the leg lengths were measured and found to be exactly equal.  Hip was then dislocated again and the femoral head and neck removed.  The femoral broach was removed.  Size 12 Corail stem with a standard offset neck was then impacted into the femur following native anteversion.  Has excellent purchase in the canal.  Excellent torsional and rotational and axial stability.  It is confirmed to be in the canal on AP and lateral fluoroscopic views.  The 36 + 1.5 ceramic head was placed and the hips reduced with outstanding stability.  Again AP pelvis was taken and it confirmed that the leg lengths were equal.  The wound was then copiously irrigated with saline solution and the capsule reattached and repaired with Ethibond suture.  A 20 mL of Exparel with 15 mL of saline injected into the capsule and into the edge of the tensor fascia lata as well as subcutaneous tissue.  The fascia overlying the tensor fascia lata was then closed with a running #1 V-Loc.  Subcu was closed with interrupted 2-0 Vicryl and subcuticular running 4-0 Monocryl.  Incision was cleaned and dried.  Steri-Strips and a bulky sterile dressing applied.  Hemovac drain was hooked to suction and then  he was awakened and transported to recovery in stable condition.     Ollen Gross, M.D.     FA/MEDQ  D:  04/19/2012  T:  04/20/2012  Job:  [id number]

## 2012-04-20 NOTE — Evaluation (Signed)
Physical Therapy Evaluation Patient Details Name: Patrick Baldwin MRN: [id number] DOB: 1950-12-22 Today's Date: 04/20/2012 Time:  -     PT Assessment / Plan / Recommendation Clinical Impression       PT Assessment  Patient needs continued PT services    Follow Up Recommendations  Home health PT    Does the patient have the potential to tolerate intense rehabilitation      Barriers to Discharge        Equipment Recommendations  Rolling walker with 5" wheels    Recommendations for Other Services OT consult   Frequency 7X/week    Precautions / Restrictions Precautions Precautions: None (Per PA)) Restrictions Weight Bearing Restrictions: Yes LLE Weight Bearing: Partial weight bearing LLE Partial Weight Bearing Percentage or Pounds: 50   Pertinent Vitals/Pain 7/10 with ambulation; RN aware, pt premedicated, ice pack provided      Mobility  Bed Mobility Bed Mobility: Supine to Sit Supine to Sit: 4: Min assist Details for Bed Mobility Assistance: cues for sequence and min assist for L LE Transfers Transfers: Sit to Stand;Stand to Sit Sit to Stand: 4: Min assist Stand to Sit: 4: Min assist Details for Transfer Assistance: Cues for use of UEs and for LE management Ambulation/Gait Ambulation/Gait Assistance: 4: Min assist Assistive device: Rolling walker Gait Pattern: Step-to pattern    Shoulder Instructions     Exercises Total Joint Exercises Ankle Circles/Pumps: AROM;10 reps;Supine;Both Quad Sets: AROM;10 reps;Supine;Both Heel Slides: AAROM;Left;15 reps;Supine Hip ABduction/ADduction: AAROM;15 reps;Supine;Left   PT Diagnosis: Difficulty walking  PT Problem List: Decreased strength;Decreased range of motion;Decreased activity tolerance;Decreased mobility;Decreased knowledge of use of DME;Pain;Obesity PT Treatment Interventions: DME instruction;Stair training;Gait training;Therapeutic activities;Functional mobility training;Therapeutic exercise;Patient/family  education   PT Goals Acute Rehab PT Goals PT Goal Formulation: With patient Time For Goal Achievement: 04/24/12 Potential to Achieve Goals: Good Pt will go Supine/Side to Sit: with supervision Pt will go Sit to Supine/Side: with supervision Pt will go Sit to Stand: with supervision Pt will go Stand to Sit: with supervision Pt will Ambulate: 51 - 150 feet Pt will Go Up / Down Stairs: 1-2 stairs;with min assist;with least restrictive assistive device  Visit Information  Last PT Received On: 04/20/12 Assistance Needed: +1    Subjective Data  Subjective: The Dr said there was a small crack in the femur so I could only put half my weight on it Patient Stated Goal: I want to do stuff again.  I am tired of being a piece of furnitture.   Prior Functioning  Home Living Lives With: Spouse Available Help at Discharge: Family Type of Home: House Home Access: Stairs to enter Secretary/administrator of Steps: 1 Home Layout: One level Prior Function Level of Independence: Independent Able to Take Stairs?: Yes Driving: Yes Vocation: Retired Musician: No difficulties    Cognition  Overall Cognitive Status: Appears within functional limits for tasks assessed/performed Arousal/Alertness: Awake/alert Orientation Level: Appears intact for tasks assessed Behavior During Session: [facility] for tasks performed    Extremity/Trunk Assessment Right Upper Extremity Assessment RUE ROM/Strength/Tone: [facility] for tasks assessed Left Upper Extremity Assessment LUE ROM/Strength/Tone: WFL for tasks assessed Right Lower Extremity Assessment RLE ROM/Strength/Tone: [facility] for tasks assessed Left Lower Extremity Assessment LLE ROM/Strength/Tone: Deficits LLE ROM/Strength/Tone Deficits: Hip strength 2+/5 with AAROM to 90 flex and 20 abd   Balance    End of Session PT - End of Session Equipment Utilized During Treatment: Gait belt Activity Tolerance: Patient tolerated treatment well;Patient  limited by pain Patient left:  in chair;with call bell/phone within reach Nurse Communication: Mobility status  GP     Patrick Baldwin 04/20/2012, 2:38 PM

## 2012-04-20 NOTE — Care Management Note (Signed)
    Page 1 of 2   04/21/2012     1:21:49 PM   CARE MANAGEMENT NOTE 04/21/2012  Patient:  [facility]   Account Number:  [id number]  Date Initiated:  04/20/2012  Documentation initiated by:  Colleen Can  Subjective/Objective Assessment:   dx osteoarthritis left hip-Total hip replacemnt-anterior approach     Action/Plan:   CM spoke with patient . Plans are for patient to return to his home in [place] where spouse will be caregiver. hE WILL NEED RW AND HHPT.  PT WANTS HH AGENCY IN NETWORK.LIST OF HH AGENCIES GIVEN   Anticipated DC Date:  04/21/2012   Anticipated DC Plan:  HOME W HOME HEALTH SERVICES  In-house referral  NA      DC Planning Services  CM consult      [facility] Choice  HOME HEALTH  DURABLE MEDICAL EQUIPMENT   Choice offered to / List presented to:  C-1 Patient   DME arranged  Levan Hurst      DME agency  [facility]     HH arranged  HH-2 PT      [facility] agency  [facility]   Status of service:  Completed, signed off Medicare Important Message given?  NO (If response is "NO", the following Medicare IM given date fields will be blank) Date Medicare IM given:   Date Additional Medicare IM given:    Discharge Disposition:  HOME W HOME HEALTH SERVICES  Per UR Regulation:  Reviewed for med. necessity/level of care/duration of stay  If discussed at Long Length of Stay Meetings, dates discussed:    Comments:  04/21/2012 Damaris Schooner RN CCM [phone] ADVISED [facility] THAT PT WILL DISCHARGE TODAY/START OF SERVICE 04/21/2012. CONTACT INFORMATION FOR HH AGENCY GIVEN TO PATIENT.,   04/20/2012 Damaris Schooner RN CCM [phone] TCT [facility]; spoke with intake-Sarah who advised that [facility] could provide HHpt services.  face sheet, hh orders, op note and h&p faxed to [phone] with confirmation. CM will follow

## 2012-04-20 NOTE — Progress Notes (Addendum)
Physical Therapy Treatment Note   04/20/12 1600  PT Visit Information  Last PT Received On 04/20/12  Assistance Needed +1  PT Time Calculation  PT Start Time 1502  PT Stop Time 1518  PT Time Calculation (min) 16 min  Subjective Data  Subjective "What's your weight bearing status?"  "280."  Precautions  Precautions None (Per PA))  Restrictions  Weight Bearing Restrictions Yes  LLE Weight Bearing PWB  LLE Partial Weight Bearing Percentage or Pounds 50  Cognition  Overall Cognitive Status Appears within functional limits for tasks assessed/performed  Bed Mobility  Bed Mobility Sit to Supine  Supine to Sit 4: Min guard  Details for Bed Mobility Assistance pt cued to keep legs together upon bringing onto bed, no assist required   Transfers  Transfers Sit to Stand;Stand to Sit  Sit to Stand 4: Min guard;With upper extremity assist;From chair/3-in-1  Stand to Sit 4: Min guard;With upper extremity assist;To bed  Details for Transfer Assistance verbal cues for safe technique  Ambulation/Gait  Ambulation/Gait Assistance 4: Min guard  Ambulation Distance (Feet) 160 Feet  Assistive device Rolling walker  Ambulation/Gait Assistance Details verbal cues for PWB, RW distance  Gait Pattern Step-through pattern  PT - End of Session  Activity Tolerance Patient tolerated treatment well  Patient left in bed;with call bell/phone within reach  Nurse Communication Other (comment) (RN in to give pain meds)  PT - Assessment/Plan  Comments on Treatment Session Pt ambulated in hallway and cued for PWB status multiple times.  Pt reports little pain with ambulation however stated 5/10 pregait.  PT Plan Discharge plan remains appropriate;Frequency remains appropriate  Follow Up Recommendations Home health PT  Equipment Recommended Rolling walker with 5" wheels  Acute Rehab PT Goals  PT Goal: Sit to Supine/Side - Progress Partly met  PT Goal: Sit to Stand - Progress Progressing toward goal  PT Goal:  Stand to Sit - Progress Progressing toward goal  Pt will Ambulate 51 - 150 feet;with modified independence;with least restrictive assistive device  PT Goal: Ambulate - Progress Progressing toward goal  PT General Charges  $$ ACUTE PT VISIT 1 Procedure  PT Treatments  $Gait Training 8-22 mins    Also performed ankle pumps and LAQ x 10 prior to gait.  Zenovia Jarred, PT Pager: [phone]

## 2012-04-20 NOTE — Progress Notes (Signed)
OT Note:  Pt screened for OT.  He has a high commode, accessible shower and help for adls if needed.  Robert Lee, OTR/L [phone] 04/20/2012

## 2012-04-20 NOTE — Progress Notes (Signed)
   Subjective: 1 Day Post-Op Procedure(s) (LRB): TOTAL HIP ARTHROPLASTY ANTERIOR APPROACH (Left) Patient reports pain as mild.   Patient seen in rounds with Dr. Lequita Halt. Patient is well, and has had no acute complaints or problems We will start therapy today.  Plan is to go Home after hospital stay.  Objective: Vital signs in last 24 hours: Temp:  [97.4 F (36.3 C)-99.2 F (37.3 C)] 98.5 F (36.9 C) (10/31 0505) Pulse Rate:  [61-100] 97  (10/31 0505) Resp:  [12-20] 16  (10/31 0505) BP: (105-139)/(60-82) 120/76 mmHg (10/31 0505) SpO2:  [95 %-100 %] 97 % (10/31 0505) Weight:  [118.842 kg (262 lb)] 118.842 kg (262 lb) (10/30 1630)  Intake/Output from previous day:  Intake/Output Summary (Last 24 hours) at 04/20/12 0904 Last data filed at 04/20/12 0600  Gross per 24 hour  Intake 4426.67 ml  Output   4145 ml  Net 281.67 ml    Intake/Output this shift:    Labs:  Basename 04/20/12 0450  HGB 13.2    Basename 04/20/12 0450  WBC 16.7*  RBC 4.43  HCT 38.9*  PLT 235    Basename 04/20/12 0450  NA 134*  K 4.4  CL 98  CO2 26  BUN 13  CREATININE 1.07  GLUCOSE 148*  CALCIUM 8.6   No results found for this basename: LABPT:2,INR:2 in the last 72 hours  EXAM General - Patient is Alert, Appropriate and Oriented Extremity - Neurovascular intact Sensation intact distally Dorsiflexion/Plantar flexion intact Dressing - dressing C/D/I Motor Function - intact, moving foot and toes well on exam.  Hemovac pulled without difficulty.  Past Medical History  Diagnosis Date  . Depression   . GERD (gastroesophageal reflux disease)   . Obesity   . ED (erectile dysfunction)   . OSA on CPAP   . Elevated glucose 2009  . Allergy     rhinitis  . Anxiety   . Low back pain   . Hx of colonic polyps     Dr Kinnie Scales  . Arthritis     Assessment/Plan: 1 Day Post-Op Procedure(s) (LRB): TOTAL HIP ARTHROPLASTY ANTERIOR APPROACH (Left) Principal Problem:  *OA (osteoarthritis) of  hip Active Problems:  Postop Hyponatremia  Postop Acute blood loss anemia  Estimated Body mass index is 37.59 kg/(m^2) as calculated from the following:   Height as of this encounter: 5\' 10" (1.778 m).   Weight as of this encounter: 262 lb(118.842 kg). Advance diet Up with therapy Plan for discharge tomorrow Discharge home with home health  DVT Prophylaxis - Xarelto, Aspirin 81 mg on hold Weight Bearing As Tolerated left Leg D/C Knee Immobilizer Hemovac Pulled Begin Therapy No vaccines.  Patrick Baldwin 04/20/2012, 9:04 AM

## 2012-04-21 LAB — CBC
HCT: 33.9 % — ABNORMAL LOW (ref 39.0–52.0)
Hemoglobin: 11.3 g/dL — ABNORMAL LOW (ref 13.0–17.0)
MCV: 89.2 fL (ref 78.0–100.0)
RBC: 3.8 MIL/uL — ABNORMAL LOW (ref 4.22–5.81)
WBC: 12.2 10*3/uL — ABNORMAL HIGH (ref 4.0–10.5)

## 2012-04-21 LAB — BASIC METABOLIC PANEL
BUN: 15 mg/dL (ref 6–23)
CO2: 29 mEq/L (ref 19–32)
Chloride: 102 mEq/L (ref 96–112)
Creatinine, Ser: 1.34 mg/dL (ref 0.50–1.35)
Glucose, Bld: 157 mg/dL — ABNORMAL HIGH (ref 70–99)

## 2012-04-21 LAB — GLUCOSE, CAPILLARY
Glucose-Capillary: 160 mg/dL — ABNORMAL HIGH (ref 70–99)
Glucose-Capillary: 124 mg/dL — ABNORMAL HIGH (ref 70–99)

## 2012-04-21 MED ORDER — RIVAROXABAN 10 MG PO TABS: 10.0000 mg | ORAL_TABLET | Freq: Every day | ORAL | Status: DC

## 2012-04-21 MED ORDER — OXYCODONE HCL 5 MG PO TABS: 5.0000 mg | ORAL_TABLET | ORAL | Status: DC | PRN

## 2012-04-21 MED ORDER — METHOCARBAMOL 500 MG PO TABS: 500.0000 mg | ORAL_TABLET | Freq: Four times a day (QID) | ORAL | Status: DC | PRN

## 2012-04-21 NOTE — Progress Notes (Signed)
Physical Therapy Treatment Note   04/21/12 1400  PT Visit Information  Last PT Received On 04/21/12  Assistance Needed +1  PT Time Calculation  PT Start Time 1336  PT Stop Time 1348  PT Time Calculation (min) 12 min  Subjective Data  Subjective pt ready for d/c home  Precautions  Precautions None  Restrictions  Weight Bearing Restrictions Yes  LLE Weight Bearing PWB  LLE Partial Weight Bearing Percentage or Pounds 50  Cognition  Overall Cognitive Status Appears within functional limits for tasks assessed/performed  Transfers  Transfers Sit to Stand;Stand to Sit  Sit to Stand 5: Supervision;With upper extremity assist;From chair/3-in-1  Stand to Sit 5: Supervision;With upper extremity assist;To chair/3-in-1  Details for Transfer Assistance no verbal cues  Ambulation/Gait  Ambulation/Gait Assistance 5: Supervision  Ambulation Distance (Feet) 400 Feet  Assistive device Rolling walker  Ambulation/Gait Assistance Details verbal cue for posture  Gait Pattern Step-through pattern  Stairs Yes  Stairs Assistance 4: Min guard  Stairs Assistance Details (indicate cue type and reason) verbal cues for sequence and RW placement, spouse held RW  Stair Management Technique Step to pattern;Backwards;With walker  Number of Stairs 3   PT - End of Session  Activity Tolerance Patient tolerated treatment well  Patient left in chair;with call bell/phone within reach;with nursing in room  PT - Assessment/Plan  Comments on Treatment Session Pt ambulated again in hallway and performed stairs with spouse assisting.  Pt feels ready for d/c home.  No further questions/concerns regarding d/c.  PT Plan Discharge plan remains appropriate;Frequency remains appropriate  Follow Up Recommendations Home health PT  Equipment Recommended Rolling walker with 5" wheels  Acute Rehab PT Goals  PT Goal: Ambulate - Progress Progressing toward goal  PT Goal: Up/Down Stairs - Progress Met  PT General Charges  $$  ACUTE PT VISIT 1 Procedure  PT Treatments  $Gait Training 8-22 mins   Pt reports 5/10 L hip pain with ambulation ("doesn't hurt much"), premedicated  Zenovia Jarred, PT  Pager: [phone]

## 2012-04-21 NOTE — Progress Notes (Signed)
   Subjective: 2 Days Post-Op Procedure(s) (LRB): TOTAL HIP ARTHROPLASTY ANTERIOR APPROACH (Left) Patient reports pain as mild.   Patient seen in rounds with Dr. Lequita Halt. Patient is well, and has had no acute complaints or problems.  Progressing with therapy. Patient is ready to go home today.  Objective: Vital signs in last 24 hours: Temp:  [98.3 F (36.8 C)-100.9 F (38.3 C)] 99.9 F (37.7 C) (11/01 0649) Pulse Rate:  [96] 96  (11/01 0649) Resp:  [16-17] 16  (11/01 0649) BP: (107-142)/(66-81) 107/75 mmHg (11/01 0649) SpO2:  [96 %-99 %] 98 % (11/01 0649)  Intake/Output from previous day:  Intake/Output Summary (Last 24 hours) at 04/21/12 0752 Last data filed at 04/21/12 0344  Gross per 24 hour  Intake 1071.5 ml  Output   4225 ml  Net -3153.5 ml    Intake/Output this shift:    Labs:  Basename 04/21/12 0430 04/20/12 0450  HGB 11.3* 13.2    Basename 04/21/12 0430 04/20/12 0450  WBC 12.2* 16.7*  RBC 3.80* 4.43  HCT 33.9* 38.9*  PLT 185 235    Basename 04/21/12 0430 04/20/12 0450  NA 137 134*  K 3.8 4.4  CL 102 98  CO2 29 26  BUN 15 13  CREATININE 1.34 1.07  GLUCOSE 157* 148*  CALCIUM 8.2* 8.6   No results found for this basename: LABPT:2,INR:2 in the last 72 hours  EXAM: General - Patient is Alert, Appropriate and Oriented Extremity - Neurovascular intact Sensation intact distally Dorsiflexion/Plantar flexion intact No cellulitis present Incision - clean, dry, healed, healing Motor Function - intact, moving foot and toes well on exam.   Assessment/Plan: 2 Days Post-Op Procedure(s) (LRB): TOTAL HIP ARTHROPLASTY ANTERIOR APPROACH (Left) Procedure(s) (LRB): TOTAL HIP ARTHROPLASTY ANTERIOR APPROACH (Left) Past Medical History  Diagnosis Date  . Depression   . GERD (gastroesophageal reflux disease)   . Obesity   . ED (erectile dysfunction)   . OSA on CPAP   . Elevated glucose 2009  . Allergy     rhinitis  . Anxiety   . Low back pain   . Hx of  colonic polyps     Dr Kinnie Scales  . Arthritis    Principal Problem:  *OA (osteoarthritis) of hip Active Problems:  Postop Hyponatremia  Postop Acute blood loss anemia  Estimated Body mass index is 37.59 kg/(m^2) as calculated from the following:   Height as of this encounter: 5\' 10" (1.778 m).   Weight as of this encounter: 262 lb(118.842 kg). Up with therapy Discharge home with home health Diet - Diabetic diet Follow up - in 2 weeks Activity - PWB 50% Disposition - Home Condition Upon Discharge - Good D/C Meds - See DC Summary DVT Prophylaxis - Xarelto, Aspirin 81 mg on hold   Geraldina Parrott 04/21/2012, 7:52 AM

## 2012-04-21 NOTE — Progress Notes (Signed)
Physical Therapy Treatment Patient Details Name: Patrick Baldwin MRN: [id number] DOB: [birth year] Today's Date: 04/21/2012 Time: 6213-0865 PT Time Calculation (min): 25 min  PT Assessment / Plan / Recommendation Comments on Treatment Session  Pt ambulated, practiced stairs and performed exercises.  Spouse present and educated on stairs as well.  Handouts provided for stairs and exercises.    Follow Up Recommendations  Home health PT     Does the patient have the potential to tolerate intense rehabilitation     Barriers to Discharge        Equipment Recommendations  Rolling walker with 5" wheels    Recommendations for Other Services    Frequency     Plan Discharge plan remains appropriate;Frequency remains appropriate    Precautions / Restrictions Precautions Precautions: None Restrictions Weight Bearing Restrictions: Yes LLE Weight Bearing: Partial weight bearing LLE Partial Weight Bearing Percentage or Pounds: 50   Pertinent Vitals/Pain 5/10 L hip pain, RN reports premedicated but may give muscle relaxer.    Mobility  Bed Mobility Bed Mobility: Sit to Supine Supine to Sit: 5: Supervision Details for Bed Mobility Assistance: increased time and effort Transfers Transfers: Sit to Stand;Stand to Sit Sit to Stand: 5: Supervision;With upper extremity assist;From bed Stand to Sit: 5: Supervision;With upper extremity assist;To chair/3-in-1 Details for Transfer Assistance: verbal cue for hand placement Ambulation/Gait Ambulation/Gait Assistance: 5: Supervision Ambulation Distance (Feet): 200 Feet Assistive device: Rolling walker Ambulation/Gait Assistance Details: verbal cue for PWB and posture Gait Pattern: Step-through pattern Stairs: Yes Stairs Assistance: 4: Min guard Stairs Assistance Details (indicate cue type and reason): pt and spouse educated in safe sequence and technique, also provided handout Stair Management Technique: Step to pattern;Backwards;With walker Number  of Stairs: 3     Exercises Total Joint Exercises Ankle Circles/Pumps: AROM;Both;15 reps Quad Sets: AROM;Both;20 reps Gluteal Sets: AROM;Both;20 reps Towel Squeeze: AROM;Both;20 reps Short Arc Quad: AROM;Left;15 reps Heel Slides: AAROM;Left;15 reps Hip ABduction/ADduction: AROM;Left;15 reps Long Arc Quad: AROM;Left;15 reps   PT Diagnosis:    PT Problem List:   PT Treatment Interventions:     PT Goals Acute Rehab PT Goals PT Goal: Supine/Side to Sit - Progress: Met PT Goal: Sit to Stand - Progress: Met PT Goal: Stand to Sit - Progress: Met PT Goal: Ambulate - Progress: Progressing toward goal PT Goal: Up/Down Stairs - Progress: Met  Visit Information  Last PT Received On: 04/21/12 Assistance Needed: +1    Subjective Data  Subjective: I can go home today/   Cognition  Overall Cognitive Status: Appears within functional limits for tasks assessed/performed    Balance     End of Session PT - End of Session Activity Tolerance: Patient tolerated treatment well Patient left: in chair;with call bell/phone within reach;with nursing in room Nurse Communication: Patient requests pain meds   GP     Delitha Elms,KATHrine E 04/21/2012, 12:07 PM Pager: [phone]

## 2012-04-21 NOTE — Discharge Summary (Signed)
Physician Discharge Summary   Patient ID: Patrick Baldwin MRN: [id number] DOB/AGE: December 23, 1950 61 y.o.  Admit date: 04/19/2012 Discharge date: 04/21/2012  Primary Diagnosis: Osteoarthritis of the left hip.   Admission Diagnoses:  Past Medical History  Diagnosis Date  . Depression   . GERD (gastroesophageal reflux disease)   . Obesity   . ED (erectile dysfunction)   . OSA on CPAP   . Elevated glucose 2009  . Allergy     rhinitis  . Anxiety   . Low back pain   . Hx of colonic polyps     Dr Kinnie Scales  . Arthritis    Discharge Diagnoses:   Principal Problem:  *OA (osteoarthritis) of hip Active Problems:  Postop Hyponatremia  Postop Acute blood loss anemia  Estimated Body mass index is 37.59 kg/(m^2) as calculated from the following:   Height as of this encounter: 5\' 10" (1.778 m).   Weight as of this encounter: 262 lb(118.842 kg).  Classification of overweight in adults according to BMI (WHO, 1998)   Procedure: Procedure(s) (LRB): TOTAL HIP ARTHROPLASTY ANTERIOR APPROACH (Left)   Consults: None  HPI: Patrick Baldwin is a 61 year old male who has advanced end-  stage arthritis of his left hip with progressively worsening pain and  dysfunction. He has failed nonoperative management and presents for  total hip arthroplasty.  Laboratory Data: Admission on 04/19/2012  Component Date Value Range Status  . ABO/RH(D) 04/19/2012 O POS   Final  . Antibody Screen 04/19/2012 NEG   Final  . Sample Expiration 04/19/2012 04/22/2012   Final  . Glucose-Capillary 04/19/2012 125* 70 - 99 mg/dL Final  . ABO/RH(D) 40/98/1191 O POS   Final  . Glucose-Capillary 04/19/2012 100* 70 - 99 mg/dL Final  . Comment 1 47/82/9562 Documented in Chart   Final  . Comment 2 04/19/2012 Notify RN   Final  . WBC 04/20/2012 16.7* 4.0 - 10.5 K/uL Final  . RBC 04/20/2012 4.43  4.22 - 5.81 MIL/uL Final  . Hemoglobin 04/20/2012 13.2  13.0 - 17.0 g/dL Final  . HCT 13/01/6577 38.9* 39.0 - 52.0 % Final  . MCV 04/20/2012  87.8  78.0 - 100.0 fL Final  . MCH 04/20/2012 29.8  26.0 - 34.0 pg Final  . MCHC 04/20/2012 33.9  30.0 - 36.0 g/dL Final  . RDW 46/96/2952 13.2  11.5 - 15.5 % Final  . Platelets 04/20/2012 235  150 - 400 K/uL Final  . Sodium 04/20/2012 134* 135 - 145 mEq/L Final  . Potassium 04/20/2012 4.4  3.5 - 5.1 mEq/L Final  . Chloride 04/20/2012 98  96 - 112 mEq/L Final  . CO2 04/20/2012 26  19 - 32 mEq/L Final  . Glucose, Bld 04/20/2012 148* 70 - 99 mg/dL Final  . BUN 84/13/2440 13  6 - 23 mg/dL Final  . Creatinine, Ser 04/20/2012 1.07  0.50 - 1.35 mg/dL Final  . Calcium 04/17/2535 8.6  8.4 - 10.5 mg/dL Final  . GFR calc non Af Amer 04/20/2012 73* >90 mL/min Final  . GFR calc Af Amer 04/20/2012 85* >90 mL/min Final   Comment:                                 The eGFR has been calculated                          using the CKD EPI equation.  This calculation has not been                          validated in all clinical                          situations.                          eGFR's persistently                          <90 mL/min signify                          possible Chronic Kidney Disease.  . Glucose-Capillary 04/19/2012 109* 70 - 99 mg/dL Final  . Glucose-Capillary 04/19/2012 144* 70 - 99 mg/dL Final  . Glucose-Capillary 04/20/2012 144* 70 - 99 mg/dL Final  . Comment 1 40/98/1191 Notify RN   Final  . Glucose-Capillary 04/20/2012 121* 70 - 99 mg/dL Final  . Comment 1 47/82/9562 Notify RN   Final  . WBC 04/21/2012 12.2* 4.0 - 10.5 K/uL Final  . RBC 04/21/2012 3.80* 4.22 - 5.81 MIL/uL Final  . Hemoglobin 04/21/2012 11.3* 13.0 - 17.0 g/dL Final  . HCT 13/01/6577 33.9* 39.0 - 52.0 % Final  . MCV 04/21/2012 89.2  78.0 - 100.0 fL Final  . MCH 04/21/2012 29.7  26.0 - 34.0 pg Final  . MCHC 04/21/2012 33.3  30.0 - 36.0 g/dL Final  . RDW 46/96/2952 13.7  11.5 - 15.5 % Final  . Platelets 04/21/2012 185  150 - 400 K/uL Final  . Sodium 04/21/2012 137  135 - 145 mEq/L  Final  . Potassium 04/21/2012 3.8  3.5 - 5.1 mEq/L Final  . Chloride 04/21/2012 102  96 - 112 mEq/L Final  . CO2 04/21/2012 29  19 - 32 mEq/L Final  . Glucose, Bld 04/21/2012 157* 70 - 99 mg/dL Final  . BUN 84/13/2440 15  6 - 23 mg/dL Final  . Creatinine, Ser 04/21/2012 1.34  0.50 - 1.35 mg/dL Final  . Calcium 04/17/2535 8.2* 8.4 - 10.5 mg/dL Final  . GFR calc non Af Amer 04/21/2012 56* >90 mL/min Final  . GFR calc Af Amer 04/21/2012 64* >90 mL/min Final   Comment:                                 The eGFR has been calculated                          using the CKD EPI equation.                          This calculation has not been                          validated in all clinical                          situations.                          eGFR's persistently                          <  90 mL/min signify                          possible Chronic Kidney Disease.  . Glucose-Capillary 04/20/2012 146* 70 - 99 mg/dL Final  . Comment 1 16/03/9603 Notify RN   Final  . Glucose-Capillary 04/20/2012 156* 70 - 99 mg/dL Final  . Glucose-Capillary 04/21/2012 160* 70 - 99 mg/dL Final  Hospital Outpatient Visit on 04/14/2012  Component Date Value Range Status  . MRSA, PCR 04/14/2012 NEGATIVE  NEGATIVE Final  . Staphylococcus aureus 04/14/2012 NEGATIVE  NEGATIVE Final   Comment:                                 The Xpert SA Assay (FDA                          approved for NASAL specimens                          in patients over 40 years of age),                          is one component of                          a comprehensive surveillance                          program.  Test performance has                          been validated by [employer] for patients greater                          than or equal to 18 year old.                          It is not intended                          to diagnose infection nor to                          guide or monitor  treatment.  Marland Kitchen aPTT 04/14/2012 33  24 - 37 seconds Final  . WBC 04/14/2012 8.6  4.0 - 10.5 K/uL Final  . RBC 04/14/2012 5.51  4.22 - 5.81 MIL/uL Final  . Hemoglobin 04/14/2012 16.2  13.0 - 17.0 g/dL Final  . HCT 54/02/8118 48.8  39.0 - 52.0 % Final  . MCV 04/14/2012 88.6  78.0 - 100.0 fL Final  . MCH 04/14/2012 29.4  26.0 - 34.0 pg Final  . MCHC 04/14/2012 33.2  30.0 - 36.0 g/dL Final  . RDW 14/78/2956 13.3  11.5 - 15.5 % Final  . Platelets 04/14/2012 272  150 - 400 K/uL Final  . Sodium 04/14/2012 140  135 - 145 mEq/L Final  . Potassium 04/14/2012 4.0  3.5 - 5.1 mEq/L  Final  . Chloride 04/14/2012 102  96 - 112 mEq/L Final  . CO2 04/14/2012 28  19 - 32 mEq/L Final  . Glucose, Bld 04/14/2012 109* 70 - 99 mg/dL Final  . BUN 40/98/1191 10  6 - 23 mg/dL Final  . Creatinine, Ser 04/14/2012 1.17  0.50 - 1.35 mg/dL Final  . Calcium 47/82/9562 9.6  8.4 - 10.5 mg/dL Final  . Total Protein 04/14/2012 7.4  6.0 - 8.3 g/dL Final  . Albumin 13/01/6577 4.1  3.5 - 5.2 g/dL Final  . AST 46/96/2952 22  0 - 37 U/L Final  . ALT 04/14/2012 28  0 - 53 U/L Final  . Alkaline Phosphatase 04/14/2012 59  39 - 117 U/L Final  . Total Bilirubin 04/14/2012 0.3  0.3 - 1.2 mg/dL Final  . GFR calc non Af Amer 04/14/2012 66* >90 mL/min Final  . GFR calc Af Amer 04/14/2012 76* >90 mL/min Final   Comment:                                 The eGFR has been calculated                          using the CKD EPI equation.                          This calculation has not been                          validated in all clinical                          situations.                          eGFR's persistently                          <90 mL/min signify                          possible Chronic Kidney Disease.  Marland Kitchen Prothrombin Time 04/14/2012 12.4  11.6 - 15.2 seconds Final  . INR 04/14/2012 0.93  0.00 - 1.49 Final  . Color, Urine 04/14/2012 YELLOW  YELLOW Final  . APPearance 04/14/2012 CLEAR  CLEAR Final  . Specific Gravity,  Urine 04/14/2012 1.010  1.005 - 1.030 Final  . pH 04/14/2012 6.0  5.0 - 8.0 Final  . Glucose, UA 04/14/2012 NEGATIVE  NEGATIVE mg/dL Final  . Hgb urine dipstick 04/14/2012 NEGATIVE  NEGATIVE Final  . Bilirubin Urine 04/14/2012 NEGATIVE  NEGATIVE Final  . Ketones, ur 04/14/2012 NEGATIVE  NEGATIVE mg/dL Final  . Protein, ur 84/13/2440 NEGATIVE  NEGATIVE mg/dL Final  . Urobilinogen, UA 04/14/2012 1.0  0.0 - 1.0 mg/dL Final  . Nitrite 04/17/2535 NEGATIVE  NEGATIVE Final  . Leukocytes, UA 04/14/2012 NEGATIVE  NEGATIVE Final   MICROSCOPIC NOT DONE ON URINES WITH NEGATIVE PROTEIN, BLOOD, LEUKOCYTES, NITRITE, OR GLUCOSE <1000 mg/dL.     X-Rays:Dg Hip Complete Left  04/19/2012  *RADIOLOGY REPORT*  Clinical Data: Left total hip replacement  LEFT HIP - COMPLETE 2+ VIEW  Comparison: 04/14/2012  Findings: Two C-arm images show a total hip replacement on the left.  Components appear grossly well positioned.  No radiographically detectable complication.  IMPRESSION: Total hip arthroplasty on the left.   Original Report Authenticated By: Thomasenia Sales, M.D.    Dg Hip Complete Left  04/14/2012  *RADIOLOGY REPORT*  Clinical Data: Preop for left hip arthroplasty.  Pain.  LEFT HIP - COMPLETE 2+ VIEW  Comparison: 6/22/9 MR.  Findings: AP view pelvis and AP/frog-leg views of the left hip. Femoral heads are located.  No acute fracture.  Severe joint space narrowing and subchondral sclerosis involving the weightbearing surface of the left hip.  Right hip joint space maintained for age.  IMPRESSION: Severe left hip osteoarthritis. No acute findings.   Original Report Authenticated By: Consuello Bossier, M.D.    Dg Pelvis Portable  04/19/2012  *RADIOLOGY REPORT*  Clinical Data: Left hip replacement  PORTABLE PELVIS  Comparison: 01/27/2012  Findings: Left hip arthroplasty has been performed.  Components appear aligned in the frontal plane.  No hardware abnormality or acute osseous finding.  Surgical drain in place.   Degenerative changes of the lower lumbar spine.  Normal SI joints.  No diastasis.  IMPRESSION: Expected appearance status post left hip arthroplasty   Original Report Authenticated By: Judie Petit. Ruel Favors, M.D.    Dg C-arm 61-120 Min-no Report  04/19/2012  CLINICAL DATA: surgery   C-ARM 61-120 MINUTES  Fluoroscopy was utilized by the requesting physician.  No radiographic  interpretation.      EKG: Orders placed during the hospital encounter of 04/19/12  . EKG     Hospital Course:  Patient was admitted to [facility] and taken to the OR and underwent the above state procedure without complications.  Patient tolerated the procedure well and was later transferred to the recovery room and then to the orthopaedic floor for postoperative care.  They were given PO and IV analgesics for pain control following their surgery.  They were given 24 hours of postoperative antibiotics of  Anti-infectives     Start     Dose/Rate Route Frequency Ordered Stop   04/19/12 1930   ceFAZolin (ANCEF) IVPB 2 g/50 mL premix        2 g 100 mL/hr over 30 Minutes Intravenous Every 6 hours 04/19/12 1646 04/20/12 0232   04/19/12 1043   ceFAZolin (ANCEF) 3 g in dextrose 5 % 50 mL IVPB        3 g 160 mL/hr over 30 Minutes Intravenous 60 min pre-op 04/19/12 1043 04/19/12 1240         and started on DVT prophylaxis in the form of Xarelto.   PT and OT were ordered for total hip protocol.  The patient was allowed to be WBAT with therapy. Discharge planning was consulted to help with postop disposition and equipment needs.  Patient had a good night on the evening of surgery and started to get up OOB with therapy on day one.  Hemovac drain was pulled without difficulty. Continued to work with therapy into day two.  Dressing was changed on day two and the incision was healing well.   Patient was seen in rounds by Dr. Lequita Halt and was ready to go home later on day two.   Discharge Medications: Prior to Admission  medications   Medication Sig Start Date End Date Taking? Authorizing Provider  esomeprazole (NEXIUM) 40 MG capsule Take 40 mg by mouth daily before breakfast.   Yes Historical Provider, MD  metFORMIN (GLUCOPHAGE) 500 MG tablet take 1 tablet by mouth once daily WITH BREAKFAST  03/30/12  Yes Georgina Quint Plotnikov, MD  phenylephrine (NEO-SYNEPHRINE) 0.25 % nasal spray Place 1 spray into the nose every 4 (four) hours as needed. For nasal congestion   Yes Historical Provider, MD  Testosterone (FORTESTA) 10 MG/ACT (2%) GEL Place 3 Act onto the skin every morning. 10/22/11  Yes Georgina Quint Plotnikov, MD  glucose blood (FREESTYLE TEST STRIPS) test strip PRN 03/25/11 03/24/12  Georgina Quint Plotnikov, MD  methocarbamol (ROBAXIN) 500 MG tablet Take 1 tablet (500 mg total) by mouth every 6 (six) hours as needed. 04/21/12   Clydean Posas, PA  oxyCODONE (OXY IR/ROXICODONE) 5 MG immediate release tablet Take 1-2 tablets (5-10 mg total) by mouth every 3 (three) hours as needed. 04/21/12   Latarshia Jersey Julien Girt, PA  rivaroxaban (XARELTO) 10 MG TABS tablet Take 1 tablet (10 mg total) by mouth daily with breakfast. Take Xarelto for two and a half more weeks, then discontinue Xarelto. Once the patient has completed the Xarelto, they may resume the 81 mg Aspirin. 04/21/12   Devinn Hurwitz Julien Girt, PA    Diet: Diabetic diet Activity:PWB 50% No bending hip over 90 degrees- A "L" Angle Do not cross legs Do not let foot roll inward When turning these patients a pillow should be placed between the patient's legs to prevent crossing. Patients should have the affected knee fully extended when trying to sit or stand from all surfaces to prevent excessive hip flexion. When ambulating and turning toward the affected side the affected leg should have the toes turned out prior to moving the walker and the rest of patient's body as to prevent internal rotation/ turning in of the leg. Abduction pillows are the most effective way to prevent a  patient from not crossing legs or turning toes in at rest. If an abduction pillow is not ordered placing a regular pillow length wise between the patient's legs is also an effective reminder. It is imperative that these precautions be maintained so that the surgical hip does not dislocate. Follow-up:in 2 weeks Disposition - Home Discharged Condition: good   Discharge Orders    Future Appointments: Provider: Department: Dept Phone: Center:   06/07/2012 10:15 AM Tresa Garter, MD [facility] [phone] [facility]     Future Orders Please Complete By Expires   Diet Carb Modified      Call MD / Call 911      Comments:   If you experience chest pain or shortness of breath, CALL 911 and be transported to the hospital emergency room.  If you develope a fever above 101 F, pus (white drainage) or increased drainage or redness at the wound, or calf pain, call your surgeon's office.   Discharge instructions      Comments:   Pick up stool softner and laxative for home. Do not submerge incision under water. May shower. Continue to use ice for pain and swelling from surgery.  Take Xarelto for two and a half more weeks, then discontinue Xarelto. Once the patient has completed the Xarelto, they may resume the 81 mg Aspirin.   Constipation Prevention      Comments:   Drink plenty of fluids.  Prune juice may be helpful.  You may use a stool softener, such as Colace (over the counter) 100 mg twice a day.  Use MiraLax (over the counter) for constipation as needed.   Increase activity slowly as tolerated      Patient may shower      Comments:   You may shower without a dressing once  there is no drainage.  Do not wash over the wound.  If drainage remains, do not shower until drainage stops.   Driving restrictions      Comments:   No driving until released by the physician.   Lifting restrictions      Comments:   No lifting until released by the physician.   Follow the hip precautions as taught in  Physical Therapy      Change dressing      Comments:   You may change your dressing dressing daily with sterile 4 x 4 inch gauze dressing and paper tape.  Do not submerge the incision under water.   TED hose      Comments:   Use stockings (TED hose) for 3 weeks on both leg(s).  You may remove them at night for sleeping.   Do not sit on low chairs, stoools or toilet seats, as it may be difficult to get up from low surfaces      Partial weight bearing      Scheduling Instructions:   50% Weight Bearing left Leg       Medication List     As of 04/21/2012  8:05 AM    STOP taking these medications         aspirin EC 81 MG tablet      [employer] VITAMIN D3 1000 UNITS capsule   Generic drug: Cholecalciferol      fish oil-omega-3 fatty acids 1000 MG capsule      HYDROcodone-acetaminophen 5-325 MG per tablet   Commonly known as: NORCO/VICODIN      Ketoprofen CR 200 MG Cp24   Commonly known as: KETOPROFEN CR      sildenafil 100 MG tablet   Commonly known as: VIAGRA      vitamin C 500 MG tablet   Commonly known as: ASCORBIC ACID      TAKE these medications         esomeprazole 40 MG capsule   Commonly known as: NEXIUM   Take 40 mg by mouth daily before breakfast.      glucose blood test strip   PRN      metFORMIN 500 MG tablet   Commonly known as: GLUCOPHAGE   take 1 tablet by mouth once daily WITH BREAKFAST      methocarbamol 500 MG tablet   Commonly known as: ROBAXIN   Take 1 tablet (500 mg total) by mouth every 6 (six) hours as needed.      oxyCODONE 5 MG immediate release tablet   Commonly known as: Oxy IR/ROXICODONE   Take 1-2 tablets (5-10 mg total) by mouth every 3 (three) hours as needed.      phenylephrine 0.25 % nasal spray   Commonly known as: NEO-SYNEPHRINE   Place 1 spray into the nose every 4 (four) hours as needed. For nasal congestion      rivaroxaban 10 MG Tabs tablet   Commonly known as: XARELTO   Take 1 tablet (10 mg total) by mouth daily with  breakfast. Take Xarelto for two and a half more weeks, then discontinue Xarelto.  Once the patient has completed the Xarelto, they may resume the 81 mg Aspirin.      Testosterone 10 MG/ACT (2%) Gel   Place 3 Act onto the skin every morning.           Follow-up Information    Follow up with Loanne Drilling, MD. Schedule an appointment as soon as possible for a visit in  2 weeks.   Contact information:   [address], [address] [place] [address] [phone]          Signed: Patrica Duel 04/21/2012, 8:05 AM

## 2012-04-30 ENCOUNTER — Ambulatory Visit: Payer: 59

## 2012-04-30 ENCOUNTER — Ambulatory Visit (INDEPENDENT_AMBULATORY_CARE_PROVIDER_SITE_OTHER): Payer: 59 | Admitting: Internal Medicine

## 2012-04-30 VITALS — BP 131/79 | HR 92 | Temp 99.4°F | Resp 20

## 2012-04-30 DIAGNOSIS — M7989 Other specified soft tissue disorders: Secondary | ICD-10-CM

## 2012-04-30 DIAGNOSIS — D72829 Elevated white blood cell count, unspecified: Secondary | ICD-10-CM

## 2012-04-30 DIAGNOSIS — M109 Gout, unspecified: Secondary | ICD-10-CM

## 2012-04-30 DIAGNOSIS — R509 Fever, unspecified: Secondary | ICD-10-CM

## 2012-04-30 DIAGNOSIS — M79676 Pain in unspecified toe(s): Secondary | ICD-10-CM

## 2012-04-30 DIAGNOSIS — Z8739 Personal history of other diseases of the musculoskeletal system and connective tissue: Secondary | ICD-10-CM

## 2012-04-30 DIAGNOSIS — Z8639 Personal history of other endocrine, nutritional and metabolic disease: Secondary | ICD-10-CM

## 2012-04-30 DIAGNOSIS — M79609 Pain in unspecified limb: Secondary | ICD-10-CM

## 2012-04-30 DIAGNOSIS — Z862 Personal history of diseases of the blood and blood-forming organs and certain disorders involving the immune mechanism: Secondary | ICD-10-CM

## 2012-04-30 LAB — POCT CBC
HCT, POC: 38.3 % — AB (ref 43.5–53.7)
Hemoglobin: 11.6 g/dL — AB (ref 14.1–18.1)
Lymph, poc: 2.5 (ref 0.6–3.4)
MCH, POC: 27.9 pg (ref 27–31.2)
MCHC: 30.3 g/dL — AB (ref 31.8–35.4)
MCV: 92.1 fL (ref 80–97)
POC LYMPH PERCENT: 17.2 %L (ref 10–50)
RDW, POC: 13.4 %
WBC: 14.4 10*3/uL — AB (ref 4.6–10.2)

## 2012-04-30 LAB — POCT UA - MICROSCOPIC ONLY
Bacteria, U Microscopic: NEGATIVE
Crystals, Ur, HPF, POC: NEGATIVE
Mucus, UA: NEGATIVE
RBC, urine, microscopic: NEGATIVE

## 2012-04-30 LAB — POCT SEDIMENTATION RATE: POCT SED RATE: 86 mm/hr — AB (ref 0–22)

## 2012-04-30 LAB — POCT URINALYSIS DIPSTICK
Bilirubin, UA: NEGATIVE
Blood, UA: NEGATIVE
Glucose, UA: NEGATIVE
Ketones, UA: NEGATIVE
Nitrite, UA: NEGATIVE
Spec Grav, UA: 1.01

## 2012-04-30 MED ORDER — COLCHICINE 0.6 MG PO TABS: 0.6000 mg | ORAL_TABLET | Freq: Every day | ORAL | Status: DC

## 2012-04-30 NOTE — Patient Instructions (Signed)
Take two tabs (1.2mg ) of Colchicine, then take 0.6mg  (1 tab) one hour later if needed.  Do not exceed 3 tabs in the first 24 hours.  Then take 1 tab 2-3 times daily for the duration of the gout flare.    When this flare resolves, we can do a uric acid level to see if you may be a candidate for urate lowering therapy.  Uric acid levels can be falsely low during an acute gout attack.  Follow up with [place] in 48 hours if you are not   If you develop fever or chills, worsening pain, or new symptoms come back in or go to the emergency department.

## 2012-04-30 NOTE — Progress Notes (Signed)
Subjective:    Patient ID: Patrick Baldwin, male    DOB: 08/04/1950, 61 y.o.   MRN: [id number]  HPI  Patrick Baldwin is a 61 yr old male with a 1 day history of right great toe pain.  Has a history of gout but has not had a flare in about 10 years.  Does not know what he has been treated with previously.  Previously has had flares in both big toes.  Has had some fever, 99.33F today.  No other systemic symptoms.  Of note, he had a Left total hip replacement 11 days ago.  Hip is doing well, states incision is healing well.   Taking 5-10mg  oxycodone Q3-4 hours for pain, not touching the pain in his big toe.      Review of Systems  Constitutional: Positive for fever. Negative for chills.  Respiratory: Negative.   Cardiovascular: Negative.   Gastrointestinal: Negative.   Musculoskeletal: Positive for arthralgias (left hip replacement).  Neurological: Positive for headaches.       Objective:   Physical Exam  Vitals reviewed. Constitutional: He is oriented to person, place, and time. He appears well-developed and well-nourished.       Appears uncomfortable  HENT:  Head: Normocephalic and atraumatic.  Cardiovascular: Normal rate, regular rhythm and normal heart sounds.   Pulmonary/Chest: Effort normal and breath sounds normal. He has no wheezes. He has no rales.  Musculoskeletal:       Right ankle: Normal.       Right foot: He exhibits tenderness and swelling. He exhibits no bony tenderness.       Left foot: Normal.       Feet:       Right MTP red, swollen, warm; decreased ROM due to pain  Varicosities of right lower leg  S/p left THA 11 days ago  Neurological: He is alert and oriented to person, place, and time.  Skin: Skin is warm and dry.  Psychiatric: He has a normal mood and affect. His behavior is normal.     Filed Vitals:   04/30/12 1324  BP: 131/79  Pulse: 92  Temp: 99.4 F (37.4 C)  Resp: 20    Results for orders placed in visit on 04/30/12  POCT CBC      Component Value  Range   WBC 14.4 (*) 4.6 - 10.2 K/uL   Lymph, poc 2.5  0.6 - 3.4   POC LYMPH PERCENT 17.2  10 - 50 %L   MID (cbc) 0.9  0 - 0.9   POC MID % 6.3  0 - 12 %M   POC Granulocyte 11.0 (*) 2 - 6.9   Granulocyte percent 76.5  37 - 80 %G   RBC 4.16 (*) 4.69 - 6.13 M/uL   Hemoglobin 11.6 (*) 14.1 - 18.1 g/dL   HCT, POC 40.9 (*) 81.1 - 53.7 %   MCV 92.1  80 - 97 fL   MCH, POC 27.9  27 - 31.2 pg   MCHC 30.3 (*) 31.8 - 35.4 g/dL   RDW, POC 91.4     Platelet Count, POC 533 (*) 142 - 424 K/uL   MPV 7.5  0 - 99.8 fL  POCT URINALYSIS DIPSTICK      Component Value Range   Color, UA yellow     Clarity, UA clear     Glucose, UA neg     Bilirubin, UA neg     Ketones, UA neg     Spec Grav, UA 1.010  Blood, UA neg     pH, UA 5.5     Protein, UA neg     Urobilinogen, UA 1.0     Nitrite, UA neg     Leukocytes, UA Negative    POCT UA - MICROSCOPIC ONLY      Component Value Range   WBC, Ur, HPF, POC neg     RBC, urine, microscopic neg     Bacteria, U Microscopic neg     Mucus, UA neg     Epithelial cells, urine per micros neg     Crystals, Ur, HPF, POC neg     Casts, Ur, LPF, POC neg     Yeast, UA neg         [facility] reading (PRIMARY) by  Dr. 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      Assessment & Plan:   1. Toe pain  POCT CBC, colchicine 0.6 MG tablet, DG Toe Great Right  2. Toe swelling  POCT CBC, colchicine 0.6 MG tablet, DG Toe Great Right  3. Fever  POCT CBC, POCT SEDIMENTATION RATE, POCT urinalysis dipstick, POCT UA - Microscopic Only, DG Chest 2 View  4. Elevated white blood cell count  POCT SEDIMENTATION RATE, POCT urinalysis dipstick, POCT UA - Microscopic Only, DG Chest 2 View  5. History of gout  colchicine 0.6 MG tablet, DG Toe Great Right  6. Gout attack      Patrick Baldwin is a 61 yr old male with a likely gout attack.  Pt has a history of gout.  History, physical exam, and plain films are  consistent with gout.  WBC count is elevated at 14.4.  He has a low grade fever of 99.31F and feels somewhat feverish.  Given recent hospitalization and joint replacement we were concerned for infection.  Work up here included UA, ESR, CXR, and x-ray of the right great toe and was unrevealing.  It is possible that his white count is simply reactive due to the gout flare.  Will treat as gout.  Pt is anticoagulated prophylactically with rivaroxaban, so we will avoid NSAIDs and treat with colchicine.  Instructed pt to take two tabs right away, then one tab an hour later; may take 1 tab twice daily after that for the duration of this flare.  Cautioned him about GI upset.  He is to follow up with [place] in 48 hours if he is not drastically improved, sooner if worsening or new symptoms develop.  Pt and wife are in agreement with this plan.    Case discussed with Dr. Perrin Maltese

## 2012-04-30 NOTE — Progress Notes (Signed)
  Subjective:    Patient ID: Patrick Baldwin, male    DOB: 03-06-1951, 61 y.o.   MRN: [id number]  HPI    Review of Systems     Objective:   Physical Exam    [facility] reading (PRIMARY) by  Dr.Guest cxr clear.      Assessment & Plan:

## 2012-05-04 ENCOUNTER — Other Ambulatory Visit: Payer: Self-pay | Admitting: Internal Medicine

## 2012-06-04 ENCOUNTER — Other Ambulatory Visit: Payer: Self-pay | Admitting: Internal Medicine

## 2012-06-05 NOTE — Telephone Encounter (Signed)
Ok to Rf? 

## 2012-06-07 ENCOUNTER — Encounter: Payer: Self-pay | Admitting: Internal Medicine

## 2012-06-07 ENCOUNTER — Other Ambulatory Visit (INDEPENDENT_AMBULATORY_CARE_PROVIDER_SITE_OTHER): Payer: 59

## 2012-06-07 ENCOUNTER — Ambulatory Visit (INDEPENDENT_AMBULATORY_CARE_PROVIDER_SITE_OTHER): Payer: 59 | Admitting: Internal Medicine

## 2012-06-07 VITALS — BP 110/72 | HR 84 | Temp 97.9°F | Resp 16 | Wt 251.0 lb

## 2012-06-07 DIAGNOSIS — E291 Testicular hypofunction: Secondary | ICD-10-CM

## 2012-06-07 DIAGNOSIS — F411 Generalized anxiety disorder: Secondary | ICD-10-CM

## 2012-06-07 DIAGNOSIS — M545 Low back pain, unspecified: Secondary | ICD-10-CM

## 2012-06-07 DIAGNOSIS — D62 Acute posthemorrhagic anemia: Secondary | ICD-10-CM

## 2012-06-07 DIAGNOSIS — IMO0002 Reserved for concepts with insufficient information to code with codable children: Secondary | ICD-10-CM

## 2012-06-07 DIAGNOSIS — F329 Major depressive disorder, single episode, unspecified: Secondary | ICD-10-CM

## 2012-06-07 DIAGNOSIS — F3289 Other specified depressive episodes: Secondary | ICD-10-CM

## 2012-06-07 DIAGNOSIS — E1165 Type 2 diabetes mellitus with hyperglycemia: Secondary | ICD-10-CM

## 2012-06-07 LAB — CBC WITH DIFFERENTIAL/PLATELET
Basophils Relative: 0.4 % (ref 0.0–3.0)
Eosinophils Relative: 1.4 % (ref 0.0–5.0)
HCT: 44.8 % (ref 39.0–52.0)
Lymphs Abs: 1.5 10*3/uL (ref 0.7–4.0)
MCV: 88.8 fl (ref 78.0–100.0)
Monocytes Absolute: 0.6 10*3/uL (ref 0.1–1.0)
RBC: 5.04 Mil/uL (ref 4.22–5.81)
WBC: 6.2 10*3/uL (ref 4.5–10.5)

## 2012-06-07 LAB — TSH: TSH: 0.76 u[IU]/mL (ref 0.35–5.50)

## 2012-06-07 LAB — BASIC METABOLIC PANEL
BUN: 10 mg/dL (ref 6–23)
CO2: 30 mEq/L (ref 19–32)
Chloride: 102 mEq/L (ref 96–112)
Potassium: 4.4 mEq/L (ref 3.5–5.1)

## 2012-06-07 MED ORDER — TESTOSTERONE 10 MG/ACT (2%) TD GEL: 3.0000 | Freq: Every day | TRANSDERMAL | Status: DC

## 2012-06-07 MED ORDER — HYDROCODONE-ACETAMINOPHEN 5-325 MG PO TABS: 1.0000 | ORAL_TABLET | Freq: Three times a day (TID) | ORAL | Status: DC | PRN

## 2012-06-07 NOTE — Progress Notes (Signed)
  Subjective:    Patient ID: Patrick Baldwin, male    DOB: 1950/11/26, 61 y.o.   MRN: [id number]  HPI  F/u low testosterone - doing much better overall, fatigue - better, labs.  The patient presents for a follow-up of  chronic GERD, type 2 diabetes controlled with medicines    C/o L hip pain - s/p THR by Dr Lequita Halt  BP Readings from Last 3 Encounters:  06/07/12 110/72  04/30/12 131/79  04/21/12 107/75   Wt Readings from Last 3 Encounters:  06/07/12 251 lb (113.853 kg)  04/19/12 262 lb (118.842 kg)  04/19/12 262 lb (118.842 kg)      Review of Systems  Constitutional: Negative for diaphoresis, activity change and fatigue.  HENT: Negative for congestion and neck stiffness.   Eyes: Negative for visual disturbance.  Respiratory: Negative for wheezing.   Gastrointestinal: Negative for blood in stool.  Genitourinary: Negative for decreased urine volume.  Musculoskeletal: Positive for back pain, arthralgias and gait problem.       L hip severe pain and locking  Psychiatric/Behavioral: Negative for suicidal ideas. The patient is not nervous/anxious.    No CP, no SOB    Objective:   Physical Exam  Constitutional: He is oriented to person, place, and time. He appears well-developed. No distress.       Obese   HENT:  Mouth/Throat: Oropharynx is clear and moist.  Eyes: Conjunctivae normal are normal. Pupils are equal, round, and reactive to light.  Neck: Normal range of motion. No JVD present. No thyromegaly present.  Cardiovascular: Normal rate, regular rhythm, normal heart sounds and intact distal pulses.  Exam reveals no gallop and no friction rub.   No murmur heard. Pulmonary/Chest: Effort normal and breath sounds normal. No respiratory distress. He has no wheezes. He has no rales. He exhibits no tenderness.  Abdominal: Soft. Bowel sounds are normal. He exhibits no distension and no mass. There is no tenderness. There is no rebound and no guarding.  Musculoskeletal: Normal range  of motion. He exhibits tenderness (L hip w/ROM). He exhibits no edema.  Lymphadenopathy:    He has no cervical adenopathy.  Neurological: He is alert and oriented to person, place, and time. He has normal reflexes. No cranial nerve deficit. He exhibits normal muscle tone. Coordination normal.  Skin: Skin is warm and dry. No rash noted.  Psychiatric: He has a normal mood and affect. His behavior is normal. Judgment and thought content normal.    BP 110/72  Pulse 84  Temp 97.9 F (36.6 C) (Oral)  Resp 16  Wt 251 lb (113.853 kg)   Lab Results  Component Value Date   WBC 14.4* 04/30/2012   HGB 11.6* 04/30/2012   HCT 38.3* 04/30/2012   PLT 185 04/21/2012   GLUCOSE 157* 04/21/2012   CHOL 197 04/30/2010   TRIG 153.0* 04/30/2010   HDL 37.10* 04/30/2010   LDLCALC 129* 04/30/2010   ALT 28 04/14/2012   AST 22 04/14/2012   NA 137 04/21/2012   K 3.8 04/21/2012   CL 102 04/21/2012   CREATININE 1.34 04/21/2012   BUN 15 04/21/2012   CO2 29 04/21/2012   TSH 1.05 03/10/2011   PSA 0.81 10/21/2011   INR 0.93 04/14/2012   HGBA1C 6.2 07/02/2011       Assessment & Plan:

## 2012-06-07 NOTE — Assessment & Plan Note (Signed)
Continue with current prescription therapy as reflected on the Med list.  

## 2012-06-07 NOTE — Assessment & Plan Note (Signed)
CBC

## 2012-09-06 ENCOUNTER — Ambulatory Visit (INDEPENDENT_AMBULATORY_CARE_PROVIDER_SITE_OTHER): Payer: 59 | Admitting: Internal Medicine

## 2012-09-06 ENCOUNTER — Encounter: Payer: Self-pay | Admitting: Internal Medicine

## 2012-09-06 VITALS — BP 136/90 | HR 80 | Temp 98.3°F | Resp 16 | Wt 252.0 lb

## 2012-09-06 DIAGNOSIS — M109 Gout, unspecified: Secondary | ICD-10-CM

## 2012-09-06 DIAGNOSIS — K219 Gastro-esophageal reflux disease without esophagitis: Secondary | ICD-10-CM

## 2012-09-06 DIAGNOSIS — IMO0002 Reserved for concepts with insufficient information to code with codable children: Secondary | ICD-10-CM

## 2012-09-06 DIAGNOSIS — M545 Low back pain, unspecified: Secondary | ICD-10-CM

## 2012-09-06 DIAGNOSIS — F3289 Other specified depressive episodes: Secondary | ICD-10-CM

## 2012-09-06 DIAGNOSIS — E291 Testicular hypofunction: Secondary | ICD-10-CM

## 2012-09-06 DIAGNOSIS — D62 Acute posthemorrhagic anemia: Secondary | ICD-10-CM

## 2012-09-06 DIAGNOSIS — F329 Major depressive disorder, single episode, unspecified: Secondary | ICD-10-CM

## 2012-09-06 DIAGNOSIS — J069 Acute upper respiratory infection, unspecified: Secondary | ICD-10-CM

## 2012-09-06 MED ORDER — METFORMIN HCL 500 MG PO TABS: 500.0000 mg | ORAL_TABLET | Freq: Every day | ORAL | Status: DC

## 2012-09-06 MED ORDER — TESTOSTERONE 10 MG/ACT (2%) TD GEL: 3.0000 | Freq: Every day | TRANSDERMAL | Status: DC

## 2012-09-06 MED ORDER — PROMETHAZINE-CODEINE 6.25-10 MG/5ML PO SYRP: 5.0000 mL | ORAL_SOLUTION | ORAL | Status: DC | PRN

## 2012-09-06 MED ORDER — HYDROCODONE-ACETAMINOPHEN 5-325 MG PO TABS: 1.0000 | ORAL_TABLET | Freq: Three times a day (TID) | ORAL | Status: DC | PRN

## 2012-09-06 MED ORDER — VITAMIN D 1000 UNITS PO TABS: 1000.0000 [IU] | ORAL_TABLET | Freq: Every day | ORAL | Status: AC

## 2012-09-06 MED ORDER — ESOMEPRAZOLE MAGNESIUM 40 MG PO CPDR: 40.0000 mg | DELAYED_RELEASE_CAPSULE | Freq: Every day | ORAL | Status: DC

## 2012-09-06 NOTE — Assessment & Plan Note (Signed)
Prom - cod - not /pain meds

## 2012-09-06 NOTE — Assessment & Plan Note (Signed)
Labs

## 2012-09-06 NOTE — Assessment & Plan Note (Signed)
Continue with current prescription therapy as reflected on the Med list.  

## 2012-09-06 NOTE — Progress Notes (Signed)
   Subjective:    HPI  F/u low testosterone - doing much better overall, fatigue - better, labs.  The patient presents for a follow-up of  chronic GERD, type 2 diabetes, OA controlled with medicines F/u on low testosterone - feeling better on RX    C/o L hip pain - s/p THR by Dr Lequita Halt - better  BP Readings from Last 3 Encounters:  09/06/12 136/90  06/07/12 110/72  04/30/12 131/79   Wt Readings from Last 3 Encounters:  09/06/12 252 lb (114.306 kg)  06/07/12 251 lb (113.853 kg)  04/19/12 262 lb (118.842 kg)      Review of Systems  Constitutional: Negative for diaphoresis, activity change and fatigue.  HENT: Negative for congestion and neck stiffness.   Eyes: Negative for visual disturbance.  Respiratory: Negative for wheezing.   Gastrointestinal: Negative for blood in stool.  Genitourinary: Negative for decreased urine volume.  Musculoskeletal: Positive for back pain, arthralgias and gait problem.       L hip severe pain and locking  Psychiatric/Behavioral: Negative for suicidal ideas. The patient is not nervous/anxious.    No CP, no SOB    Objective:   Physical Exam  Constitutional: He is oriented to person, place, and time. He appears well-developed. No distress.  Obese   HENT:  Mouth/Throat: Oropharynx is clear and moist.  Eyes: Conjunctivae are normal. Pupils are equal, round, and reactive to light.  Neck: Normal range of motion. No JVD present. No thyromegaly present.  Cardiovascular: Normal rate, regular rhythm, normal heart sounds and intact distal pulses.  Exam reveals no gallop and no friction rub.   No murmur heard. Pulmonary/Chest: Effort normal and breath sounds normal. No respiratory distress. He has no wheezes. He has no rales. He exhibits no tenderness.  Abdominal: Soft. Bowel sounds are normal. He exhibits no distension and no mass. There is no tenderness. There is no rebound and no guarding.  Musculoskeletal: Normal range of motion. He exhibits  tenderness (L hip w/ROM). He exhibits no edema.  Lymphadenopathy:    He has no cervical adenopathy.  Neurological: He is alert and oriented to person, place, and time. He has normal reflexes. No cranial nerve deficit. He exhibits normal muscle tone. Coordination normal.  Skin: Skin is warm and dry. No rash noted.  Psychiatric: He has a normal mood and affect. His behavior is normal. Judgment and thought content normal.    BP 136/90  Pulse 80  Temp(Src) 98.3 F (36.8 C) (Oral)  Resp 16  Wt 252 lb (114.306 kg)  BMI 36.16 kg/m2   Lab Results  Component Value Date   WBC 6.2 06/07/2012   HGB 14.6 06/07/2012   HCT 44.8 06/07/2012   PLT 303.0 06/07/2012   GLUCOSE 106* 06/07/2012   CHOL 197 04/30/2010   TRIG 153.0* 04/30/2010   HDL 37.10* 04/30/2010   LDLCALC 129* 04/30/2010   ALT 28 04/14/2012   AST 22 04/14/2012   NA 138 06/07/2012   K 4.4 06/07/2012   CL 102 06/07/2012   CREATININE 1.1 06/07/2012   BUN 10 06/07/2012   CO2 30 06/07/2012   TSH 0.76 06/07/2012   PSA 0.81 10/21/2011   INR 0.93 04/14/2012   HGBA1C 5.3 06/07/2012       Assessment & Plan:

## 2012-09-06 NOTE — Assessment & Plan Note (Signed)
Continue with current prescription therapy as reflected on the Med list. Labs  

## 2012-12-07 ENCOUNTER — Other Ambulatory Visit: Payer: Self-pay | Admitting: Internal Medicine

## 2012-12-25 ENCOUNTER — Other Ambulatory Visit (INDEPENDENT_AMBULATORY_CARE_PROVIDER_SITE_OTHER): Payer: 59

## 2012-12-25 DIAGNOSIS — M545 Low back pain, unspecified: Secondary | ICD-10-CM

## 2012-12-25 DIAGNOSIS — F3289 Other specified depressive episodes: Secondary | ICD-10-CM

## 2012-12-25 DIAGNOSIS — F329 Major depressive disorder, single episode, unspecified: Secondary | ICD-10-CM

## 2012-12-25 DIAGNOSIS — K219 Gastro-esophageal reflux disease without esophagitis: Secondary | ICD-10-CM

## 2012-12-25 DIAGNOSIS — E1165 Type 2 diabetes mellitus with hyperglycemia: Secondary | ICD-10-CM

## 2012-12-25 DIAGNOSIS — IMO0002 Reserved for concepts with insufficient information to code with codable children: Secondary | ICD-10-CM

## 2012-12-25 DIAGNOSIS — E291 Testicular hypofunction: Secondary | ICD-10-CM

## 2012-12-25 DIAGNOSIS — D62 Acute posthemorrhagic anemia: Secondary | ICD-10-CM

## 2012-12-25 DIAGNOSIS — M109 Gout, unspecified: Secondary | ICD-10-CM

## 2012-12-25 DIAGNOSIS — J069 Acute upper respiratory infection, unspecified: Secondary | ICD-10-CM

## 2012-12-25 LAB — BASIC METABOLIC PANEL
BUN: 18 mg/dL (ref 6–23)
Chloride: 105 mEq/L (ref 96–112)
Potassium: 4.4 mEq/L (ref 3.5–5.1)

## 2012-12-25 LAB — URINALYSIS
Hgb urine dipstick: NEGATIVE
Nitrite: NEGATIVE
Specific Gravity, Urine: 1.025 (ref 1.000–1.030)
Total Protein, Urine: NEGATIVE
Urobilinogen, UA: 0.2 (ref 0.0–1.0)

## 2012-12-25 LAB — CBC WITH DIFFERENTIAL/PLATELET
Basophils Relative: 0.2 % (ref 0.0–3.0)
Eosinophils Relative: 2.2 % (ref 0.0–5.0)
HCT: 48.4 % (ref 39.0–52.0)
Hemoglobin: 16.2 g/dL (ref 13.0–17.0)
Lymphs Abs: 1.8 10*3/uL (ref 0.7–4.0)
MCV: 90.2 fl (ref 78.0–100.0)
Monocytes Absolute: 0.8 10*3/uL (ref 0.1–1.0)
Neutro Abs: 5.4 10*3/uL (ref 1.4–7.7)
Platelets: 234 10*3/uL (ref 150.0–400.0)
RBC: 5.37 Mil/uL (ref 4.22–5.81)
WBC: 8.3 10*3/uL (ref 4.5–10.5)

## 2012-12-25 LAB — HEPATIC FUNCTION PANEL
Bilirubin, Direct: 0.1 mg/dL (ref 0.0–0.3)
Total Bilirubin: 0.6 mg/dL (ref 0.3–1.2)

## 2012-12-25 LAB — TSH: TSH: 1.7 u[IU]/mL (ref 0.35–5.50)

## 2012-12-25 LAB — LIPID PANEL
LDL Cholesterol: 93 mg/dL (ref 0–99)
VLDL: 21.8 mg/dL (ref 0.0–40.0)

## 2012-12-28 ENCOUNTER — Encounter: Payer: Self-pay | Admitting: Internal Medicine

## 2012-12-28 ENCOUNTER — Ambulatory Visit (INDEPENDENT_AMBULATORY_CARE_PROVIDER_SITE_OTHER): Payer: 59 | Admitting: Internal Medicine

## 2012-12-28 VITALS — BP 130/72 | HR 76 | Temp 98.3°F | Resp 16 | Ht 70.0 in | Wt 244.0 lb

## 2012-12-28 DIAGNOSIS — D62 Acute posthemorrhagic anemia: Secondary | ICD-10-CM

## 2012-12-28 DIAGNOSIS — E291 Testicular hypofunction: Secondary | ICD-10-CM

## 2012-12-28 DIAGNOSIS — M7989 Other specified soft tissue disorders: Secondary | ICD-10-CM

## 2012-12-28 DIAGNOSIS — R222 Localized swelling, mass and lump, trunk: Secondary | ICD-10-CM | POA: Insufficient documentation

## 2012-12-28 DIAGNOSIS — F3289 Other specified depressive episodes: Secondary | ICD-10-CM

## 2012-12-28 DIAGNOSIS — IMO0002 Reserved for concepts with insufficient information to code with codable children: Secondary | ICD-10-CM

## 2012-12-28 DIAGNOSIS — Z0000 Encounter for general adult medical examination without abnormal findings: Secondary | ICD-10-CM

## 2012-12-28 DIAGNOSIS — Z8739 Personal history of other diseases of the musculoskeletal system and connective tissue: Secondary | ICD-10-CM

## 2012-12-28 DIAGNOSIS — F411 Generalized anxiety disorder: Secondary | ICD-10-CM

## 2012-12-28 DIAGNOSIS — F329 Major depressive disorder, single episode, unspecified: Secondary | ICD-10-CM

## 2012-12-28 DIAGNOSIS — M169 Osteoarthritis of hip, unspecified: Secondary | ICD-10-CM

## 2012-12-28 DIAGNOSIS — M545 Low back pain, unspecified: Secondary | ICD-10-CM

## 2012-12-28 DIAGNOSIS — Z23 Encounter for immunization: Secondary | ICD-10-CM

## 2012-12-28 DIAGNOSIS — E1165 Type 2 diabetes mellitus with hyperglycemia: Secondary | ICD-10-CM

## 2012-12-28 MED ORDER — ESOMEPRAZOLE MAGNESIUM 40 MG PO CPDR: 40.0000 mg | DELAYED_RELEASE_CAPSULE | Freq: Every day | ORAL | Status: DC

## 2012-12-28 MED ORDER — PROMETHAZINE-CODEINE 6.25-10 MG/5ML PO SYRP: 5.0000 mL | ORAL_SOLUTION | ORAL | Status: DC | PRN

## 2012-12-28 MED ORDER — COLCHICINE 0.6 MG PO TABS: 0.6000 mg | ORAL_TABLET | Freq: Every day | ORAL | Status: DC

## 2012-12-28 MED ORDER — TESTOSTERONE 10 MG/ACT (2%) TD GEL: 3.0000 | Freq: Every day | TRANSDERMAL | Status: DC

## 2012-12-28 MED ORDER — HYDROCODONE-ACETAMINOPHEN 5-325 MG PO TABS: 1.0000 | ORAL_TABLET | Freq: Three times a day (TID) | ORAL | Status: DC | PRN

## 2012-12-28 MED ORDER — METFORMIN HCL 500 MG PO TABS: 500.0000 mg | ORAL_TABLET | Freq: Every day | ORAL | Status: DC

## 2012-12-28 NOTE — Assessment & Plan Note (Signed)
Continue with current prescription therapy as reflected on the Med list.  

## 2012-12-28 NOTE — Assessment & Plan Note (Signed)
L lat - painful and growing Dr Purnell Shoemaker

## 2012-12-28 NOTE — Assessment & Plan Note (Signed)
We discussed age appropriate health related issues, including available/recomended screening tests and vaccinations. We discussed a need for adhering to healthy diet and exercise. Labs/EKG were reviewed/ordered. All questions were answered.   

## 2012-12-28 NOTE — Assessment & Plan Note (Signed)
Better  

## 2012-12-28 NOTE — Progress Notes (Signed)
Subjective:    HPI The patient is here for a wellness exam. The patient has been doing well overall without major physical or psychological issues going on lately. F/u low testosterone - doing much better overall, fatigue - better, labs.  The patient presents for a follow-up of  chronic GERD, type 2 diabetes, OA controlled with medicines F/u on low testosterone - feeling better on RX C/o URI x wks F/u L hip pain - s/p THR by Dr Lequita Halt - much better C/o chest mass - growing and painful BP Readings from Last 3 Encounters:  12/28/12 130/72  09/06/12 136/90  06/07/12 110/72   Wt Readings from Last 3 Encounters:  12/28/12 244 lb (110.678 kg)  09/06/12 252 lb (114.306 kg)  06/07/12 251 lb (113.853 kg)      Review of Systems  Constitutional: Negative for diaphoresis, activity change and fatigue.  HENT: Negative for ear pain, congestion, sore throat and neck stiffness.   Eyes: Negative for visual disturbance.  Respiratory: Negative for chest tightness and wheezing.   Cardiovascular: Negative for palpitations and leg swelling.  Gastrointestinal: Negative for nausea and blood in stool.  Genitourinary: Negative for urgency, decreased urine volume and testicular pain.  Musculoskeletal: Positive for back pain and arthralgias. Negative for gait problem.  Neurological: Negative for syncope, facial asymmetry, light-headedness and headaches.  Hematological: Negative for adenopathy.  Psychiatric/Behavioral: Negative for suicidal ideas and sleep disturbance. The patient is not nervous/anxious.    No CP, no SOB    Objective:   Physical Exam  Constitutional: He is oriented to person, place, and time. He appears well-developed. No distress.  Obese   HENT:  Mouth/Throat: Oropharynx is clear and moist.  Eyes: Conjunctivae are normal. Pupils are equal, round, and reactive to light.  Neck: Normal range of motion. No JVD present. No thyromegaly present.  Cardiovascular: Normal rate, regular  rhythm, normal heart sounds and intact distal pulses.  Exam reveals no gallop and no friction rub.   No murmur heard. Pulmonary/Chest: Effort normal and breath sounds normal. No respiratory distress. He has no wheezes. He has no rales. He exhibits no tenderness.  Abdominal: Soft. Bowel sounds are normal. He exhibits no distension and no mass. There is no tenderness. There is no rebound and no guarding.  Genitourinary: Prostate normal. Guaiac positive stool.  Musculoskeletal: Normal range of motion. He exhibits tenderness (L hip w/ROM - mild; LS spine is tender w/ROM). He exhibits no edema.  Lymphadenopathy:    He has no cervical adenopathy.  Neurological: He is alert and oriented to person, place, and time. He has normal reflexes. No cranial nerve deficit. He exhibits normal muscle tone. Coordination normal.  Skin: Skin is warm and dry. No rash noted.  Psychiatric: He has a normal mood and affect. His behavior is normal. Judgment and thought content normal.  large L chest wall lipoma - tender  BP 130/72  Pulse 76  Temp(Src) 98.3 F (36.8 C) (Oral)  Resp 16  Ht 5\' 10"  (1.778 m)  Wt 244 lb (110.678 kg)  BMI 35.01 kg/m2   Lab Results  Component Value Date   WBC 8.3 12/25/2012   HGB 16.2 12/25/2012   HCT 48.4 12/25/2012   PLT 234.0 12/25/2012   GLUCOSE 128* 12/25/2012   CHOL 142 12/25/2012   TRIG 109.0 12/25/2012   HDL 27.10* 12/25/2012   LDLCALC 93 12/25/2012   ALT 20 12/25/2012   AST 18 12/25/2012   NA 141 12/25/2012   K 4.4 12/25/2012  CL 105 12/25/2012   CREATININE 1.3 12/25/2012   BUN 18 12/25/2012   CO2 26 12/25/2012   TSH 1.70 12/25/2012   PSA 0.81 10/21/2011   INR 0.93 04/14/2012   HGBA1C 6.1 12/25/2012       Assessment & Plan:

## 2013-01-05 ENCOUNTER — Encounter (INDEPENDENT_AMBULATORY_CARE_PROVIDER_SITE_OTHER): Payer: Self-pay | Admitting: Surgery

## 2013-01-05 ENCOUNTER — Ambulatory Visit (INDEPENDENT_AMBULATORY_CARE_PROVIDER_SITE_OTHER): Payer: 59 | Admitting: Surgery

## 2013-01-05 VITALS — BP 124/72 | HR 68 | Temp 98.6°F | Resp 16 | Ht 70.0 in | Wt 245.6 lb

## 2013-01-05 DIAGNOSIS — R222 Localized swelling, mass and lump, trunk: Secondary | ICD-10-CM

## 2013-01-05 NOTE — Progress Notes (Signed)
Patient ID: Patrick Baldwin, male   DOB: 10-25-1950, 62 y.o.   MRN: [id number]  Chief Complaint  Patient presents with  . New Evaluation    eval left side wall mass    HPI Patrick Baldwin is a 62 y.o. male.  Referred by Dr. Posey Rea for evaluation of left chest wall mass  HPI This is a 62 year old male with early diabetes who presents with almost a 10 year history of a slowly enlarging mass on the left side of his chest near his flank. This has become larger and is now starting to cause pain.  This has never become infected.  He comes in for evaluation for excision. Past Medical History  Diagnosis Date  . Depression   . GERD (gastroesophageal reflux disease)   . Obesity   . ED (erectile dysfunction)   . OSA on CPAP   . Elevated glucose 2009  . Allergy     rhinitis  . Anxiety   . Low back pain   . Hx of colonic polyps     Dr Kinnie Scales  . Arthritis   . Diabetes mellitus without complication     Past Surgical History  Procedure Laterality Date  . Arthrosc l hip surgery  2009    Dr Sherlean Foot  . Tonsillectomy      as child  . Knee arthroscopy and arthrotomy  1970's  . Varicose vein surgery  1980's  . Upper gastrointestinal endoscopy    . Colonoscopy    . Total hip arthroplasty  04/19/2012    Procedure: TOTAL HIP ARTHROPLASTY ANTERIOR APPROACH;  Surgeon: Loanne Drilling, MD;  Location: [facility];  Service: Orthopedics;  Laterality: Left;  . Knee ligament reconstruction      removed cartliage    Family History  Problem Relation Age of Onset  . Coronary artery disease Other   . Stroke Mother   . Heart disease Mother   . Hypertension Mother   . Diabetes Father     Social History History  Substance Use Topics  . Smoking status: Former Games developer  . Smokeless tobacco: Former Neurosurgeon    Quit date: 04/15/1999  . Alcohol Use: No    Allergies  Allergen Reactions  . Bupropion Hcl     REACTION: was grinding his teeth  . Cortisone Swelling and Other (See Comments)    Leg swelling,  fever     Current Outpatient Prescriptions  Medication Sig Dispense Refill  . cholecalciferol (VITAMIN D) 1000 UNITS tablet Take 1 tablet (1,000 Units total) by mouth daily.  100 tablet  3  . colchicine 0.6 MG tablet Take 1 tablet (0.6 mg total) by mouth daily. Take two tabs (1.2mg  total) initially.  Take 0.6mg  1 hour later if needed.  Do not exceed 1.8mg  (3 tabs) in one day.  After that may take 1 tab 2-3 times per day for the duration of symptoms.  90 tablet  3  . esomeprazole (NEXIUM) 40 MG capsule Take 1 capsule (40 mg total) by mouth daily before breakfast.  90 capsule  3  . glucosamine-chondroitin 500-400 MG tablet Take 1 tablet by mouth 3 (three) times daily.      Marland Kitchen HYDROcodone-acetaminophen (NORCO/VICODIN) 5-325 MG per tablet Take 1 tablet by mouth every 8 (eight) hours as needed for pain.  90 tablet  2  . metFORMIN (GLUCOPHAGE) 500 MG tablet Take 1 tablet (500 mg total) by mouth daily with breakfast.  90 tablet  3  . Testosterone (FORTESTA) 10 MG/ACT (2%) GEL  Place 3 Act onto the skin daily.  60 g  5  . glucose blood test strip PRN      . promethazine-codeine (PHENERGAN WITH CODEINE) 6.25-10 MG/5ML syrup Take 5 mLs by mouth every 4 (four) hours as needed for cough.  300 mL  0   No current facility-administered medications for this visit.    Review of Systems Review of Systems  Constitutional: Negative for fever, chills and unexpected weight change.  HENT: Negative for hearing loss, congestion, sore throat, trouble swallowing and voice change.   Eyes: Negative for visual disturbance.  Respiratory: Negative for cough and wheezing.   Cardiovascular: Negative for chest pain, palpitations and leg swelling.  Gastrointestinal: Negative for nausea, vomiting, abdominal pain, diarrhea, constipation, blood in stool, abdominal distention, anal bleeding and rectal pain.  Genitourinary: Negative for hematuria and difficulty urinating.  Musculoskeletal: Positive for myalgias, back pain and arthralgias.   Skin: Negative for rash and wound.  Neurological: Negative for seizures, syncope, weakness and headaches.  Hematological: Negative for adenopathy. Does not bruise/bleed easily.  Psychiatric/Behavioral: Negative for confusion.    Blood pressure 124/72, pulse 68, temperature 98.6 F (37 C), temperature source Temporal, resp. rate 16, height 5\' 10"  (1.778 m), weight 245 lb 9.6 oz (111.403 kg).  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  The surgical procedure has been discussed with the patient.  Potential risks, benefits, alternative treatments, and expected outcomes have been explained.  All of the patient's questions at this time have been answered.  The likelihood of reaching the patient's treatment goal is good.  The patient understand the proposed surgical procedure and wishes to proceed.         Jaimarie Rapozo K. 01/05/2013, 11:46 AM

## 2013-02-12 ENCOUNTER — Other Ambulatory Visit: Payer: Self-pay | Admitting: Internal Medicine

## 2013-02-16 NOTE — Pre-Procedure Instructions (Signed)
Patrick Baldwin  02/16/2013   Your procedure is scheduled on: Wednesday, September 10th.  Report to [facility] at 6:30 AM.  Call this number if you have problems the morning of surgery: [phone]   Remember:   Do not eat food or drink liquids after midnight.   Take these medicines the morning of surgery with A SIP OF WATER: esomeprazole (NEXIUM).  Take if needed:colchicine, HYDROcodone-acetaminophen (NORCO/VICODIN),promethazine-codeine (PHENERGAN WITH CODEINE).   Do not wear jewelry, make-up or nail polish.  Do not wear lotions, powders, or perfumes. You may wear deodorant.  Men may shave face and neck.  Do not bring valuables to the hospital.  [facility] is not responsible for any belongings or valuables.  Contacts, dentures or bridgework may not be worn into surgery.  Leave suitcase in the car. After surgery it may be brought to your room.  For patients admitted to the hospital, checkout time is 11:00 AM the day of discharge.   Patients discharged the day of surgery will not be allowed to drive home.  Name and phone number of your driver: -   Special Instructions: Shower using CHG 2 nights before surgery and the night before surgery.  If you shower the day of surgery use CHG.  Use special wash - you have one bottle of CHG for all showers.  You should use approximately 1/3 of the bottle for each shower.   Please read over the following fact sheets that you were given: Pain Booklet, Coughing and Deep Breathing and Surgical Site Infection Prevention

## 2013-02-20 ENCOUNTER — Encounter (HOSPITAL_COMMUNITY): Payer: Self-pay

## 2013-02-20 ENCOUNTER — Encounter (HOSPITAL_COMMUNITY)
Admission: RE | Admit: 2013-02-20 | Discharge: 2013-02-20 | Disposition: A | Discharge status: Home / Self Care | Payer: 59 | Source: Ambulatory Visit | Attending: Surgery | Admitting: Surgery

## 2013-02-20 DIAGNOSIS — Z01818 Encounter for other preprocedural examination: Secondary | ICD-10-CM | POA: Insufficient documentation

## 2013-02-20 DIAGNOSIS — Z01812 Encounter for preprocedural laboratory examination: Secondary | ICD-10-CM | POA: Insufficient documentation

## 2013-02-20 LAB — BASIC METABOLIC PANEL
Calcium: 9.4 mg/dL (ref 8.4–10.5)
GFR calc Af Amer: 79 mL/min — ABNORMAL LOW (ref >90)
GFR calc non Af Amer: 68 mL/min — ABNORMAL LOW (ref >90)
Potassium: 4.5 mEq/L (ref 3.5–5.1)
Sodium: 138 mEq/L (ref 135–145)

## 2013-02-20 LAB — CBC
Hemoglobin: 16.2 g/dL (ref 13.0–17.0)
Platelets: 187 10*3/uL (ref 150–400)
RBC: 5.23 MIL/uL (ref 4.22–5.81)

## 2013-02-20 MED ORDER — CHLORHEXIDINE GLUCONATE 4 % EX LIQD: 1.0000 "application " | Freq: Once | CUTANEOUS | Status: DC

## 2013-02-21 NOTE — Progress Notes (Signed)
Called Dr. Shelle Iron and Dr. Loren Racer office regarding sleep study and last OV notes, left message with medical records to send info ASAP.

## 2013-02-27 MED ORDER — CEFAZOLIN SODIUM-DEXTROSE 2-3 GM-% IV SOLR
2.0000 g | INTRAVENOUS | Status: AC
  Administered 2013-02-28: 2 g via INTRAVENOUS
  Filled 2013-02-27: qty 50

## 2013-02-28 ENCOUNTER — Ambulatory Visit (HOSPITAL_COMMUNITY)
Admission: RE | Admit: 2013-02-28 | Discharge: 2013-02-28 | Disposition: A | Discharge status: Home / Self Care | Payer: 59 | Source: Ambulatory Visit | Attending: Surgery | Admitting: Surgery

## 2013-02-28 ENCOUNTER — Ambulatory Visit (HOSPITAL_COMMUNITY): Payer: 59 | Admitting: Certified Registered"

## 2013-02-28 ENCOUNTER — Encounter (HOSPITAL_COMMUNITY): Payer: Self-pay | Admitting: Certified Registered"

## 2013-02-28 ENCOUNTER — Encounter (HOSPITAL_COMMUNITY)
Admission: RE | Disposition: A | Discharge status: Home / Self Care | Payer: Self-pay | Source: Ambulatory Visit | Attending: Surgery

## 2013-02-28 DIAGNOSIS — R222 Localized swelling, mass and lump, trunk: Secondary | ICD-10-CM

## 2013-02-28 DIAGNOSIS — D1739 Benign lipomatous neoplasm of skin and subcutaneous tissue of other sites: Secondary | ICD-10-CM

## 2013-02-28 DIAGNOSIS — E119 Type 2 diabetes mellitus without complications: Secondary | ICD-10-CM | POA: Insufficient documentation

## 2013-02-28 DIAGNOSIS — D1779 Benign lipomatous neoplasm of other sites: Secondary | ICD-10-CM | POA: Insufficient documentation

## 2013-02-28 HISTORY — PX: LIPOMA EXCISION: SHX5283

## 2013-02-28 LAB — GLUCOSE, CAPILLARY
Glucose-Capillary: 117 mg/dL — ABNORMAL HIGH (ref 70–99)
Glucose-Capillary: 99 mg/dL (ref 70–99)

## 2013-02-28 SURGERY — EXCISION LIPOMA
Anesthesia: General | Site: Flank | Laterality: Left | Wound class: Clean

## 2013-02-28 MED ORDER — HYDROMORPHONE HCL PF 1 MG/ML IJ SOLN
0.2500 mg | INTRAMUSCULAR | Status: DC | PRN
  Administered 2013-02-28 (×2): 0.25 mg via INTRAVENOUS

## 2013-02-28 MED ORDER — LIDOCAINE HCL (CARDIAC) 20 MG/ML IV SOLN
INTRAVENOUS | Status: DC | PRN
  Administered 2013-02-28: 100 mg via INTRAVENOUS

## 2013-02-28 MED ORDER — ONDANSETRON HCL 4 MG/2ML IJ SOLN
INTRAMUSCULAR | Status: DC | PRN
  Administered 2013-02-28: 4 mg via INTRAVENOUS

## 2013-02-28 MED ORDER — MEPERIDINE HCL 25 MG/ML IJ SOLN: 6.2500 mg | INTRAMUSCULAR | Status: DC | PRN

## 2013-02-28 MED ORDER — OXYCODONE HCL 5 MG PO TABS
5.0000 mg | ORAL_TABLET | Freq: Once | ORAL | Status: AC | PRN
  Administered 2013-02-28: 5 mg via ORAL

## 2013-02-28 MED ORDER — OXYCODONE HCL 5 MG PO TABS
ORAL_TABLET | ORAL | Status: AC
  Filled 2013-02-28: qty 1

## 2013-02-28 MED ORDER — HYDROMORPHONE HCL PF 1 MG/ML IJ SOLN
INTRAMUSCULAR | Status: AC
  Filled 2013-02-28: qty 1

## 2013-02-28 MED ORDER — 0.9 % SODIUM CHLORIDE (POUR BTL) OPTIME
TOPICAL | Status: DC | PRN
  Administered 2013-02-28: 1000 mL

## 2013-02-28 MED ORDER — OXYCODONE HCL 5 MG/5ML PO SOLN: 5.0000 mg | Freq: Once | ORAL | Status: AC | PRN

## 2013-02-28 MED ORDER — BUPIVACAINE-EPINEPHRINE 0.25% -1:200000 IJ SOLN
INTRAMUSCULAR | Status: DC | PRN
  Administered 2013-02-28: 10 mL

## 2013-02-28 MED ORDER — PROPOFOL 10 MG/ML IV BOLUS
INTRAVENOUS | Status: DC | PRN
  Administered 2013-02-28: 150 mg via INTRAVENOUS

## 2013-02-28 MED ORDER — FENTANYL CITRATE 0.05 MG/ML IJ SOLN
INTRAMUSCULAR | Status: DC | PRN
  Administered 2013-02-28 (×2): 50 ug via INTRAVENOUS

## 2013-02-28 MED ORDER — HYDROCODONE-ACETAMINOPHEN 5-325 MG PO TABS: 1.0000 | ORAL_TABLET | ORAL | Status: DC | PRN

## 2013-02-28 MED ORDER — BUPIVACAINE-EPINEPHRINE PF 0.25-1:200000 % IJ SOLN
INTRAMUSCULAR | Status: AC
  Filled 2013-02-28: qty 30

## 2013-02-28 MED ORDER — ONDANSETRON HCL 4 MG/2ML IJ SOLN: 4.0000 mg | Freq: Once | INTRAMUSCULAR | Status: DC | PRN

## 2013-02-28 MED ORDER — MIDAZOLAM HCL 5 MG/5ML IJ SOLN
INTRAMUSCULAR | Status: DC | PRN
  Administered 2013-02-28: 2 mg via INTRAVENOUS

## 2013-02-28 MED ORDER — ONDANSETRON HCL 4 MG/2ML IJ SOLN: 4.0000 mg | INTRAMUSCULAR | Status: DC | PRN

## 2013-02-28 MED ORDER — LACTATED RINGERS IV SOLN
INTRAVENOUS | Status: DC | PRN
  Administered 2013-02-28 (×2): via INTRAVENOUS

## 2013-02-28 MED ORDER — MORPHINE SULFATE 2 MG/ML IJ SOLN: 2.0000 mg | INTRAMUSCULAR | Status: DC | PRN

## 2013-02-28 MED ORDER — PHENYLEPHRINE HCL 10 MG/ML IJ SOLN
INTRAMUSCULAR | Status: DC | PRN
  Administered 2013-02-28: 80 ug via INTRAVENOUS

## 2013-02-28 SURGICAL SUPPLY — 43 items
BENZOIN TINCTURE PRP APPL 2/3 (GAUZE/BANDAGES/DRESSINGS) ×2 IMPLANT
BLADE SURG 10 STRL SS (BLADE) ×2 IMPLANT
BLADE SURG 15 STRL LF DISP TIS (BLADE) ×1 IMPLANT
BLADE SURG 15 STRL SS (BLADE) ×1
BLADE SURG ROTATE 9660 (MISCELLANEOUS) IMPLANT
CHLORAPREP W/TINT 26ML (MISCELLANEOUS) IMPLANT
CLEANER TIP ELECTROSURG 2X2 (MISCELLANEOUS) ×2 IMPLANT
CLOTH BEACON ORANGE TIMEOUT ST (SAFETY) ×2 IMPLANT
COVER SURGICAL LIGHT HANDLE (MISCELLANEOUS) ×2 IMPLANT
DECANTER SPIKE VIAL GLASS SM (MISCELLANEOUS) IMPLANT
DRAPE LAPAROTOMY TRNSV 102X78 (DRAPE) ×2 IMPLANT
DRAPE UTILITY 15X26 W/TAPE STR (DRAPE) ×4 IMPLANT
DRSG TEGADERM 4X4.75 (GAUZE/BANDAGES/DRESSINGS) ×2 IMPLANT
ELECT REM PT RETURN 9FT ADLT (ELECTROSURGICAL) ×2
ELECTRODE REM PT RTRN 9FT ADLT (ELECTROSURGICAL) ×1 IMPLANT
GLOVE BIO SURGEON STRL SZ7 (GLOVE) ×2 IMPLANT
GLOVE BIO SURGEON STRL SZ7.5 (GLOVE) ×2 IMPLANT
GLOVE BIOGEL PI IND STRL 7.0 (GLOVE) ×1 IMPLANT
GLOVE BIOGEL PI IND STRL 7.5 (GLOVE) ×2 IMPLANT
GLOVE BIOGEL PI INDICATOR 7.0 (GLOVE) ×1
GLOVE BIOGEL PI INDICATOR 7.5 (GLOVE) ×2
GOWN STRL NON-REIN LRG LVL3 (GOWN DISPOSABLE) ×4 IMPLANT
KIT BASIN OR (CUSTOM PROCEDURE TRAY) ×2 IMPLANT
KIT ROOM TURNOVER OR (KITS) ×2 IMPLANT
NEEDLE HYPO 25GX1X1/2 BEV (NEEDLE) IMPLANT
NS IRRIG 1000ML POUR BTL (IV SOLUTION) ×2 IMPLANT
PACK SURGICAL SETUP 50X90 (CUSTOM PROCEDURE TRAY) ×2 IMPLANT
PAD ARMBOARD 7.5X6 YLW CONV (MISCELLANEOUS) ×4 IMPLANT
PENCIL BUTTON HOLSTER BLD 10FT (ELECTRODE) ×2 IMPLANT
SPECIMEN JAR SMALL (MISCELLANEOUS) IMPLANT
SPONGE GAUZE 4X4 12PLY (GAUZE/BANDAGES/DRESSINGS) ×2 IMPLANT
SPONGE LAP 18X18 X RAY DECT (DISPOSABLE) ×2 IMPLANT
STRIP CLOSURE SKIN 1/2X4 (GAUZE/BANDAGES/DRESSINGS) ×2 IMPLANT
SUT MNCRL AB 4-0 PS2 18 (SUTURE) ×2 IMPLANT
SUT VIC AB 2-0 SH 27 (SUTURE)
SUT VIC AB 2-0 SH 27X BRD (SUTURE) IMPLANT
SUT VIC AB 3-0 SH 27 (SUTURE) ×1
SUT VIC AB 3-0 SH 27XBRD (SUTURE) ×1 IMPLANT
SYR BULB 3OZ (MISCELLANEOUS) ×2 IMPLANT
SYR CONTROL 10ML LL (SYRINGE) IMPLANT
TOWEL OR 17X24 6PK STRL BLUE (TOWEL DISPOSABLE) ×2 IMPLANT
TOWEL OR 17X26 10 PK STRL BLUE (TOWEL DISPOSABLE) ×2 IMPLANT
WATER STERILE IRR 1000ML POUR (IV SOLUTION) ×2 IMPLANT

## 2013-02-28 NOTE — Anesthesia Postprocedure Evaluation (Signed)
Anesthesia Post Note  Patient: Patrick Baldwin  Procedure(s) Performed: Procedure(s) (LRB): EXCISION LIPOMA LEFT FLANK (Left)  Anesthesia type: general  Patient location: PACU  Post pain: Pain level controlled  Post assessment: Patient's Cardiovascular Status Stable  Last Vitals:  Filed Vitals:   02/28/13 1000  BP: 121/77  Pulse: 49  Temp: 36.6 C  Resp: 18    Post vital signs: Reviewed and stable  Level of consciousness: sedated  Complications: No apparent anesthesia complications

## 2013-02-28 NOTE — H&P (Signed)
HPI  Patrick Baldwin is a 62 y.o. male. Referred by Dr. Posey Rea for evaluation of left chest wall mass  HPI  This is a 62 year old male with early diabetes who presents with almost a 10 year history of a slowly enlarging mass on the left side of his chest near his flank. This has become larger and is now starting to cause pain. This has never become infected. He comes in for evaluation for excision.  Past Medical History   Diagnosis  Date   .  Depression    .  GERD (gastroesophageal reflux disease)    .  Obesity    .  ED (erectile dysfunction)    .  OSA on CPAP    .  Elevated glucose  2009   .  Allergy      rhinitis   .  Anxiety    .  Low back pain    .  Hx of colonic polyps      Dr Kinnie Scales   .  Arthritis    .  Diabetes mellitus without complication     Past Surgical History   Procedure  Laterality  Date   .  Arthrosc l hip surgery   2009     Dr Sherlean Foot   .  Tonsillectomy       as child   .  Knee arthroscopy and arthrotomy   1970's   .  Varicose vein surgery   1980's   .  Upper gastrointestinal endoscopy     .  Colonoscopy     .  Total hip arthroplasty   04/19/2012     Procedure: TOTAL HIP ARTHROPLASTY ANTERIOR APPROACH; Surgeon: Loanne Drilling, MD; Location: [facility]; Service: Orthopedics; Laterality: Left;   .  Knee ligament reconstruction       removed cartliage    Family History   Problem  Relation  Age of Onset   .  Coronary artery disease  Other    .  Stroke  Mother    .  Heart disease  Mother    .  Hypertension  Mother    .  Diabetes  Father    Social History  History   Substance Use Topics   .  Smoking status:  Former Games developer   .  Smokeless tobacco:  Former Neurosurgeon     Quit date:  04/15/1999   .  Alcohol Use:  No    Allergies   Allergen  Reactions   .  Bupropion Hcl      REACTION: was grinding his teeth   .  Cortisone  Swelling and Other (See Comments)     Leg swelling, fever    Current Outpatient Prescriptions   Medication  Sig  Dispense  Refill   .   cholecalciferol (VITAMIN D) 1000 UNITS tablet  Take 1 tablet (1,000 Units total) by mouth daily.  100 tablet  3   .  colchicine 0.6 MG tablet  Take 1 tablet (0.6 mg total) by mouth daily. Take two tabs (1.2mg  total) initially. Take 0.6mg  1 hour later if needed. Do not exceed 1.8mg  (3 tabs) in one day. After that may take 1 tab 2-3 times per day for the duration of symptoms.  90 tablet  3   .  esomeprazole (NEXIUM) 40 MG capsule  Take 1 capsule (40 mg total) by mouth daily before breakfast.  90 capsule  3   .  glucosamine-chondroitin 500-400 MG tablet  Take 1 tablet by mouth  3 (three) times daily.     Marland Kitchen  HYDROcodone-acetaminophen (NORCO/VICODIN) 5-325 MG per tablet  Take 1 tablet by mouth every 8 (eight) hours as needed for pain.  90 tablet  2   .  metFORMIN (GLUCOPHAGE) 500 MG tablet  Take 1 tablet (500 mg total) by mouth daily with breakfast.  90 tablet  3   .  Testosterone (FORTESTA) 10 MG/ACT (2%) GEL  Place 3 Act onto the skin daily.  60 g  5   .  glucose blood test strip  PRN     .  promethazine-codeine (PHENERGAN WITH CODEINE) 6.25-10 MG/5ML syrup  Take 5 mLs by mouth every 4 (four) hours as needed for cough.  300 mL  0    No current facility-administered medications for this visit.   Review of Systems  Review of Systems  Constitutional: Negative for fever, chills and unexpected weight change.  HENT: Negative for hearing loss, congestion, sore throat, trouble swallowing and voice change.  Eyes: Negative for visual disturbance.  Respiratory: Negative for cough and wheezing.  Cardiovascular: Negative for chest pain, palpitations and leg swelling.  Gastrointestinal: Negative for nausea, vomiting, abdominal pain, diarrhea, constipation, blood in stool, abdominal distention, anal bleeding and rectal pain.  Genitourinary: Negative for hematuria and difficulty urinating.  Musculoskeletal: Positive for myalgias, back pain and arthralgias.  Skin: Negative for rash and wound.  Neurological:  Negative for seizures, syncope, weakness and headaches.  Hematological: Negative for adenopathy. Does not bruise/bleed easily.  Psychiatric/Behavioral: Negative for confusion.  Blood pressure 124/72, pulse 68, temperature 98.6 F (37 C), temperature source Temporal, resp. rate 16, height 5\' 10"  (1.778 m), weight 245 lb 9.6 oz (111.403 kg).  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 The surgical procedure has been discussed with the patient. Potential risks, benefits, alternative treatments, and expected outcomes have been explained. All of the patient's questions at this time have been answered. The likelihood of reaching the patient's treatment goal is good. The patient understand the proposed surgical procedure and wishes to proceed.    Wilmon Arms. Corliss Skains, MD, [facility]  General/ Trauma Surgery  02/28/2013 8:01 AM

## 2013-02-28 NOTE — Anesthesia Preprocedure Evaluation (Addendum)
Anesthesia Evaluation  Patient identified by MRN, date of birth, ID band Patient awake    Reviewed: Allergy & Precautions, H&P , NPO status , Patient's Chart, lab work & pertinent test results, reviewed documented beta blocker date and time   Airway Mallampati: II TM Distance: >3 FB Neck ROM: Full    Dental  (+) Teeth Intact and Dental Advisory Given   Pulmonary sleep apnea and Continuous Positive Airway Pressure Ventilation , former smoker,          Cardiovascular     Neuro/Psych PSYCHIATRIC DISORDERS Anxiety Depression    GI/Hepatic GERD-  ,  Endo/Other  diabetes, Type 2, Oral Hypoglycemic Agents  Renal/GU      Musculoskeletal   Abdominal   Peds  Hematology   Anesthesia Other Findings   Reproductive/Obstetrics                          Anesthesia Physical Anesthesia Plan  ASA: III  Anesthesia Plan: General   Post-op Pain Management:    Induction: Intravenous  Airway Management Planned: LMA  Additional Equipment:   Intra-op Plan:   Post-operative Plan: Extubation in OR  Informed Consent: I have reviewed the patients History and Physical, chart, labs and discussed the procedure including the risks, benefits and alternatives for the proposed anesthesia with the patient or authorized representative who has indicated his/her understanding and acceptance.   Dental advisory given  Plan Discussed with: Surgeon and CRNA  Anesthesia Plan Comments:        Anesthesia Quick Evaluation

## 2013-02-28 NOTE — Anesthesia Procedure Notes (Signed)
Procedure Name: LMA Insertion Date/Time: 02/28/2013 8:21 AM Performed by: Jerilee Hoh Pre-anesthesia Checklist: Patient identified, Emergency Drugs available, Suction available and Patient being monitored Patient Re-evaluated:Patient Re-evaluated prior to inductionOxygen Delivery Method: Circle system utilized Preoxygenation: Pre-oxygenation with 100% oxygen Intubation Type: IV induction LMA: LMA inserted LMA Size: 4.0 Tube type: Oral Number of attempts: 1 Placement Confirmation: breath sounds checked- equal and bilateral and positive ETCO2 Tube secured with: Tape Dental Injury: Teeth and Oropharynx as per pre-operative assessment

## 2013-02-28 NOTE — Preoperative (Signed)
Beta Blockers   Reason not to administer Beta Blockers:Not Applicable 

## 2013-02-28 NOTE — Op Note (Signed)
Pre-op Diagnosis - Subcutaneous lipoma - left chest Post-op Diagnosis - same Procedure:  Excision of subcutaneous lipoma - left chest 5 cm Surgeon:  Wynona Luna. Anesthesia:  Gen - LMA Indications - 62 yo male with enlarging mass on left chest, slightly uncomfortable.  Presents now for excision of mass Description of procedure:  The patient was brought to the operating room and placed in a supine position on the operating table.  After an adequate level of anesthesia was obtained, he was positioned with a bump under his left side.  His left chest was prepped with ChloraPrep and draped in sterile fashion.  A timeout was taken.  We infiltrated the area over the lipoma with 0.25% marcaine with epinephrine.  A transverse incision was made.  Dissection was carried down to the surface of the lipoma with cautery.  We dissected around the medial end of the lipoma and dissected the mass off of the underlying muscle.  The lipoma was removed in its entirety and sent for pathologic examination.  We inspected for hemostasis.  The wound was closed with 3-0 Vicryl, closing down the dead space in front of the muscle.  4-0 Monocryl was used to close the skin.  Steri-strips and a clean dressing were applied.  The patient was extubated and brought to the recovery room in sterile condition.  All counts were correct.  Wilmon Arms. Corliss Skains, MD, [facility] Surgery  General/ Trauma Surgery  02/28/2013 9:10 AM

## 2013-02-28 NOTE — Transfer of Care (Signed)
Immediate Anesthesia Transfer of Care Note  Patient: Patrick Baldwin  Procedure(s) Performed: Procedure(s): EXCISION LIPOMA LEFT FLANK (Left)  Patient Location: PACU  Anesthesia Type:General  Level of Consciousness: awake, alert , oriented and patient cooperative  Airway & Oxygen Therapy: Patient Spontanous Breathing and Patient connected to face mask oxygen  Post-op Assessment: Report given to PACU RN, Post -op Vital signs reviewed and stable and Patient moving all extremities  Post vital signs: Reviewed and stable  Complications: No apparent anesthesia complications

## 2013-03-02 ENCOUNTER — Encounter (HOSPITAL_COMMUNITY): Payer: Self-pay | Admitting: Surgery

## 2013-03-15 ENCOUNTER — Ambulatory Visit (INDEPENDENT_AMBULATORY_CARE_PROVIDER_SITE_OTHER): Payer: 59 | Admitting: Surgery

## 2013-03-15 ENCOUNTER — Encounter (INDEPENDENT_AMBULATORY_CARE_PROVIDER_SITE_OTHER): Payer: Self-pay | Admitting: Surgery

## 2013-03-15 ENCOUNTER — Ambulatory Visit (INDEPENDENT_AMBULATORY_CARE_PROVIDER_SITE_OTHER): Payer: 59

## 2013-03-15 VITALS — BP 127/84 | HR 70 | Temp 97.4°F | Resp 16 | Ht 70.0 in | Wt 247.2 lb

## 2013-03-15 DIAGNOSIS — R222 Localized swelling, mass and lump, trunk: Secondary | ICD-10-CM

## 2013-03-15 DIAGNOSIS — Z23 Encounter for immunization: Secondary | ICD-10-CM

## 2013-03-15 NOTE — Progress Notes (Signed)
Status post excision of a left chest lipoma on 02/28/13. The pathology confirmed over the lipoma. Incision is well-healed with no sign of infection. Normal pain. The patient has a persistent headache but I doubt that this is related to the surgery. He will talk to his primary care physician about this. Followup as needed.  Wilmon Arms. Corliss Skains, MD, [facility] Surgery  General/ Trauma Surgery  03/15/2013 9:17 AM

## 2013-03-16 ENCOUNTER — Encounter: Payer: Self-pay | Admitting: Internal Medicine

## 2013-03-16 ENCOUNTER — Encounter: Payer: Self-pay | Admitting: Pulmonary Disease

## 2013-03-21 ENCOUNTER — Encounter: Payer: Self-pay | Admitting: Cardiology

## 2013-04-16 ENCOUNTER — Telehealth: Payer: Self-pay | Admitting: Internal Medicine

## 2013-04-16 NOTE — Telephone Encounter (Signed)
04/16/2013  Pt left message requesting refill on Hydrocodone.  Call back # [phone]

## 2013-04-17 MED ORDER — HYDROCODONE-ACETAMINOPHEN 5-325 MG PO TABS: 1.0000 | ORAL_TABLET | Freq: Three times a day (TID) | ORAL | Status: DC | PRN

## 2013-04-17 NOTE — Telephone Encounter (Signed)
Rx ready for p/u. .Pt informed  

## 2013-04-17 NOTE — Telephone Encounter (Signed)
OK to fill this prescription with additional refills x0 Thank you!  

## 2013-05-01 ENCOUNTER — Encounter: Payer: Self-pay | Admitting: Internal Medicine

## 2013-05-01 ENCOUNTER — Ambulatory Visit (INDEPENDENT_AMBULATORY_CARE_PROVIDER_SITE_OTHER): Payer: 59 | Admitting: Internal Medicine

## 2013-05-01 VITALS — BP 120/64 | HR 59 | Temp 98.3°F | Wt 251.0 lb

## 2013-05-01 DIAGNOSIS — M545 Low back pain: Secondary | ICD-10-CM

## 2013-05-01 DIAGNOSIS — E1165 Type 2 diabetes mellitus with hyperglycemia: Secondary | ICD-10-CM

## 2013-05-01 DIAGNOSIS — R222 Localized swelling, mass and lump, trunk: Secondary | ICD-10-CM

## 2013-05-01 DIAGNOSIS — G4733 Obstructive sleep apnea (adult) (pediatric): Secondary | ICD-10-CM

## 2013-05-01 DIAGNOSIS — D62 Acute posthemorrhagic anemia: Secondary | ICD-10-CM

## 2013-05-01 DIAGNOSIS — E291 Testicular hypofunction: Secondary | ICD-10-CM

## 2013-05-01 DIAGNOSIS — F329 Major depressive disorder, single episode, unspecified: Secondary | ICD-10-CM

## 2013-05-01 MED ORDER — HYDROCODONE-ACETAMINOPHEN 5-325 MG PO TABS: 1.0000 | ORAL_TABLET | Freq: Three times a day (TID) | ORAL | Status: DC | PRN

## 2013-05-01 MED ORDER — TESTOSTERONE 10 MG/ACT (2%) TD GEL: 3.0000 | Freq: Every day | TRANSDERMAL | Status: DC

## 2013-05-01 NOTE — Assessment & Plan Note (Signed)
Continue with current prescription therapy as reflected on the Med list.  

## 2013-05-01 NOTE — Progress Notes (Signed)
   Subjective:    HPI  F/u low testosterone - doing much better overall, fatigue - better, labs.  The patient presents for a follow-up of  chronic GERD, type 2 diabetes, OA controlled with medicines F/u on low testosterone - feeling better on RX    C/o L hip pain - s/p THR by Dr Lequita Halt in 2014- better  BP Readings from Last 3 Encounters:  05/01/13 120/64  03/15/13 127/84  02/28/13 121/77   Wt Readings from Last 3 Encounters:  05/01/13 251 lb (113.853 kg)  03/15/13 247 lb 3.2 oz (112.129 kg)  02/20/13 250 lb 6 oz (113.569 kg)      Review of Systems  Constitutional: Negative for diaphoresis, activity change and fatigue.  HENT: Negative for congestion.   Eyes: Negative for visual disturbance.  Respiratory: Negative for wheezing.   Gastrointestinal: Negative for blood in stool.  Genitourinary: Negative for decreased urine volume.  Musculoskeletal: Positive for arthralgias, back pain and gait problem. Negative for neck stiffness.       L hip severe pain and locking  Psychiatric/Behavioral: Negative for suicidal ideas. The patient is not nervous/anxious.    No CP, no SOB    Objective:   Physical Exam  Constitutional: He is oriented to person, place, and time. He appears well-developed. No distress.  Obese   HENT:  Mouth/Throat: Oropharynx is clear and moist.  Eyes: Conjunctivae are normal. Pupils are equal, round, and reactive to light.  Neck: Normal range of motion. No JVD present. No thyromegaly present.  Cardiovascular: Normal rate, regular rhythm, normal heart sounds and intact distal pulses.  Exam reveals no gallop and no friction rub.   No murmur heard. Pulmonary/Chest: Effort normal and breath sounds normal. No respiratory distress. He has no wheezes. He has no rales. He exhibits no tenderness.  Abdominal: Soft. Bowel sounds are normal. He exhibits no distension and no mass. There is no tenderness. There is no rebound and no guarding.  Musculoskeletal: Normal  range of motion. He exhibits tenderness (L hip w/ROM). He exhibits no edema.  Lymphadenopathy:    He has no cervical adenopathy.  Neurological: He is alert and oriented to person, place, and time. He has normal reflexes. No cranial nerve deficit. He exhibits normal muscle tone. Coordination normal.  Skin: Skin is warm and dry. No rash noted.  Psychiatric: He has a normal mood and affect. His behavior is normal. Judgment and thought content normal.    BP 120/64  Pulse 59  Temp(Src) 98.3 F (36.8 C) (Oral)  Wt 251 lb (113.853 kg)  SpO2 97%   Lab Results  Component Value Date   WBC 9.8 02/20/2013   HGB 16.2 02/20/2013   HCT 46.8 02/20/2013   PLT 187 02/20/2013   GLUCOSE 118* 02/20/2013   CHOL 142 12/25/2012   TRIG 109.0 12/25/2012   HDL 27.10* 12/25/2012   LDLCALC 93 12/25/2012   ALT 20 12/25/2012   AST 18 12/25/2012   NA 138 02/20/2013   K 4.5 02/20/2013   CL 101 02/20/2013   CREATININE 1.13 02/20/2013   BUN 17 02/20/2013   CO2 25 02/20/2013   TSH 1.70 12/25/2012   PSA 0.81 10/21/2011   INR 0.93 04/14/2012   HGBA1C 6.1 12/25/2012       Assessment & Plan:

## 2013-05-01 NOTE — Assessment & Plan Note (Signed)
L lat - painful - removed 2014

## 2013-07-22 IMAGING — CR DG CHEST 2V
2 series · 2 of 2 positions shown · non-contrast
Comparison: Preliminary reading of Dr. India, prior chest x-ray
02/06/2008

CLINICAL DATA: Low grade fever

CHEST - 2 VIEW

[PA]
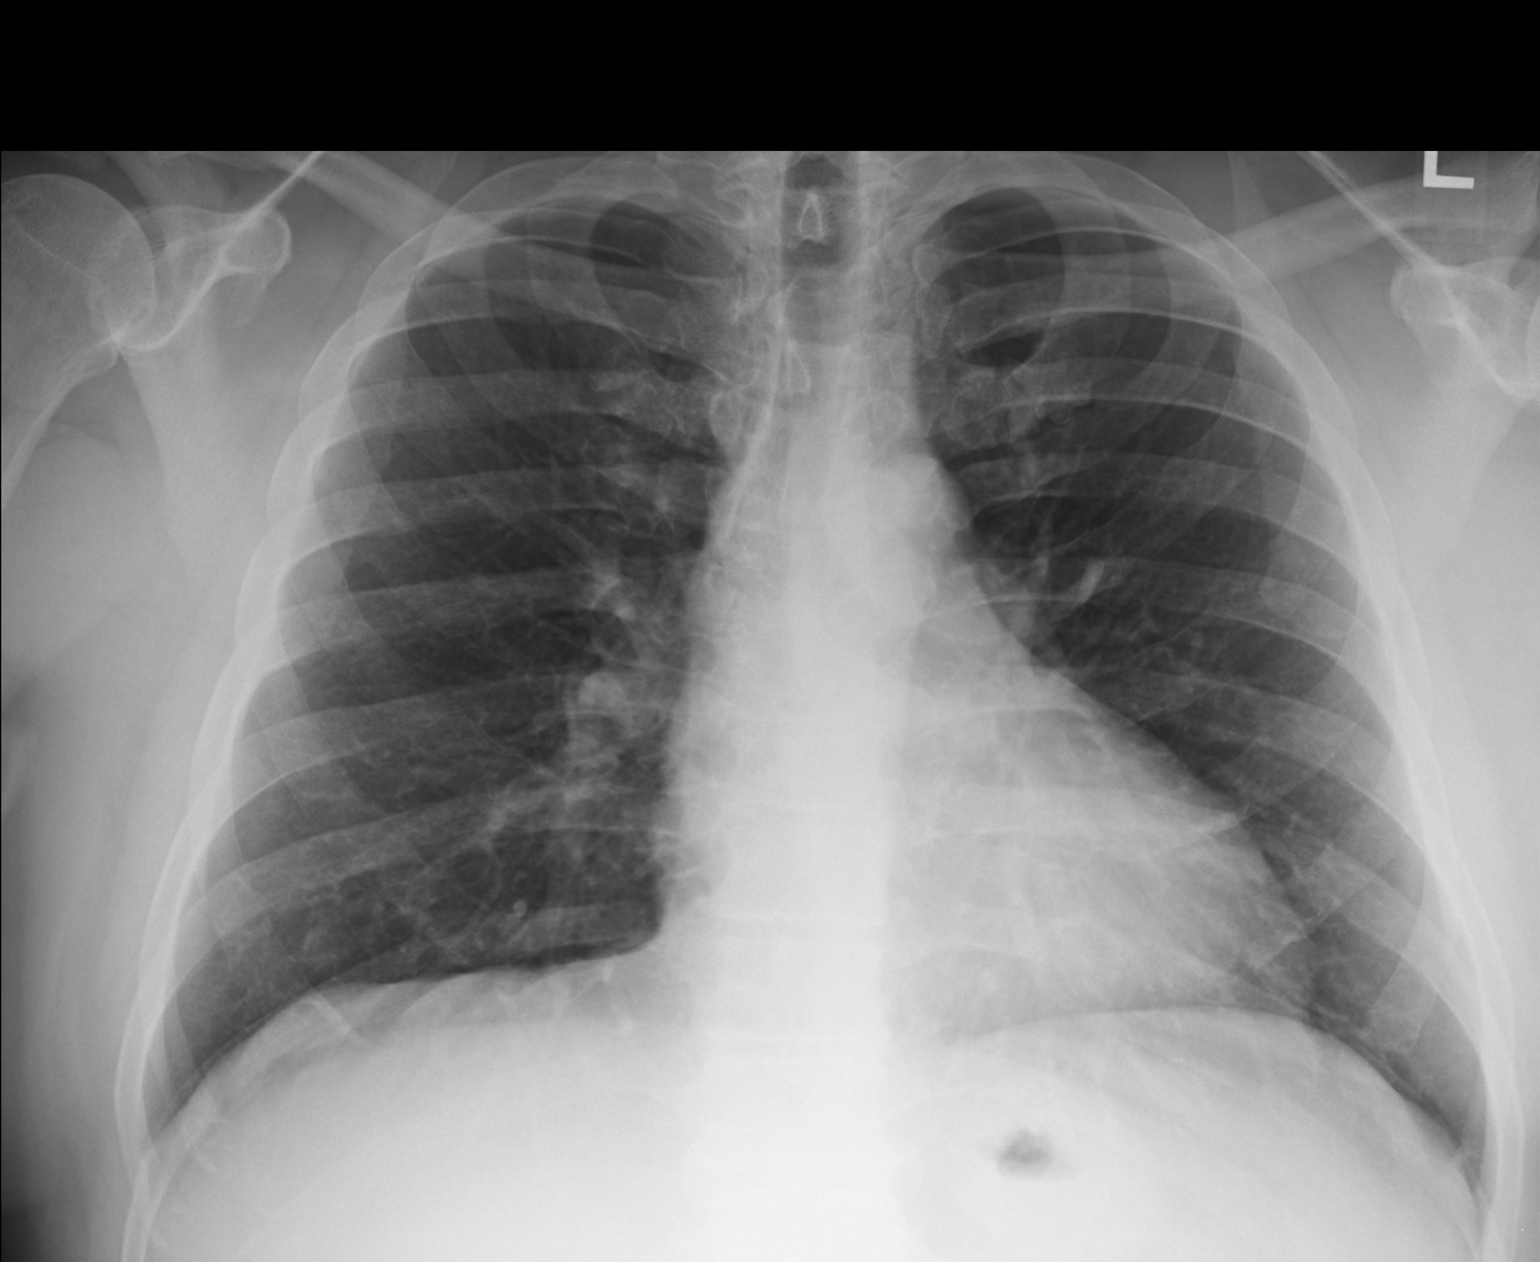

[lateral]
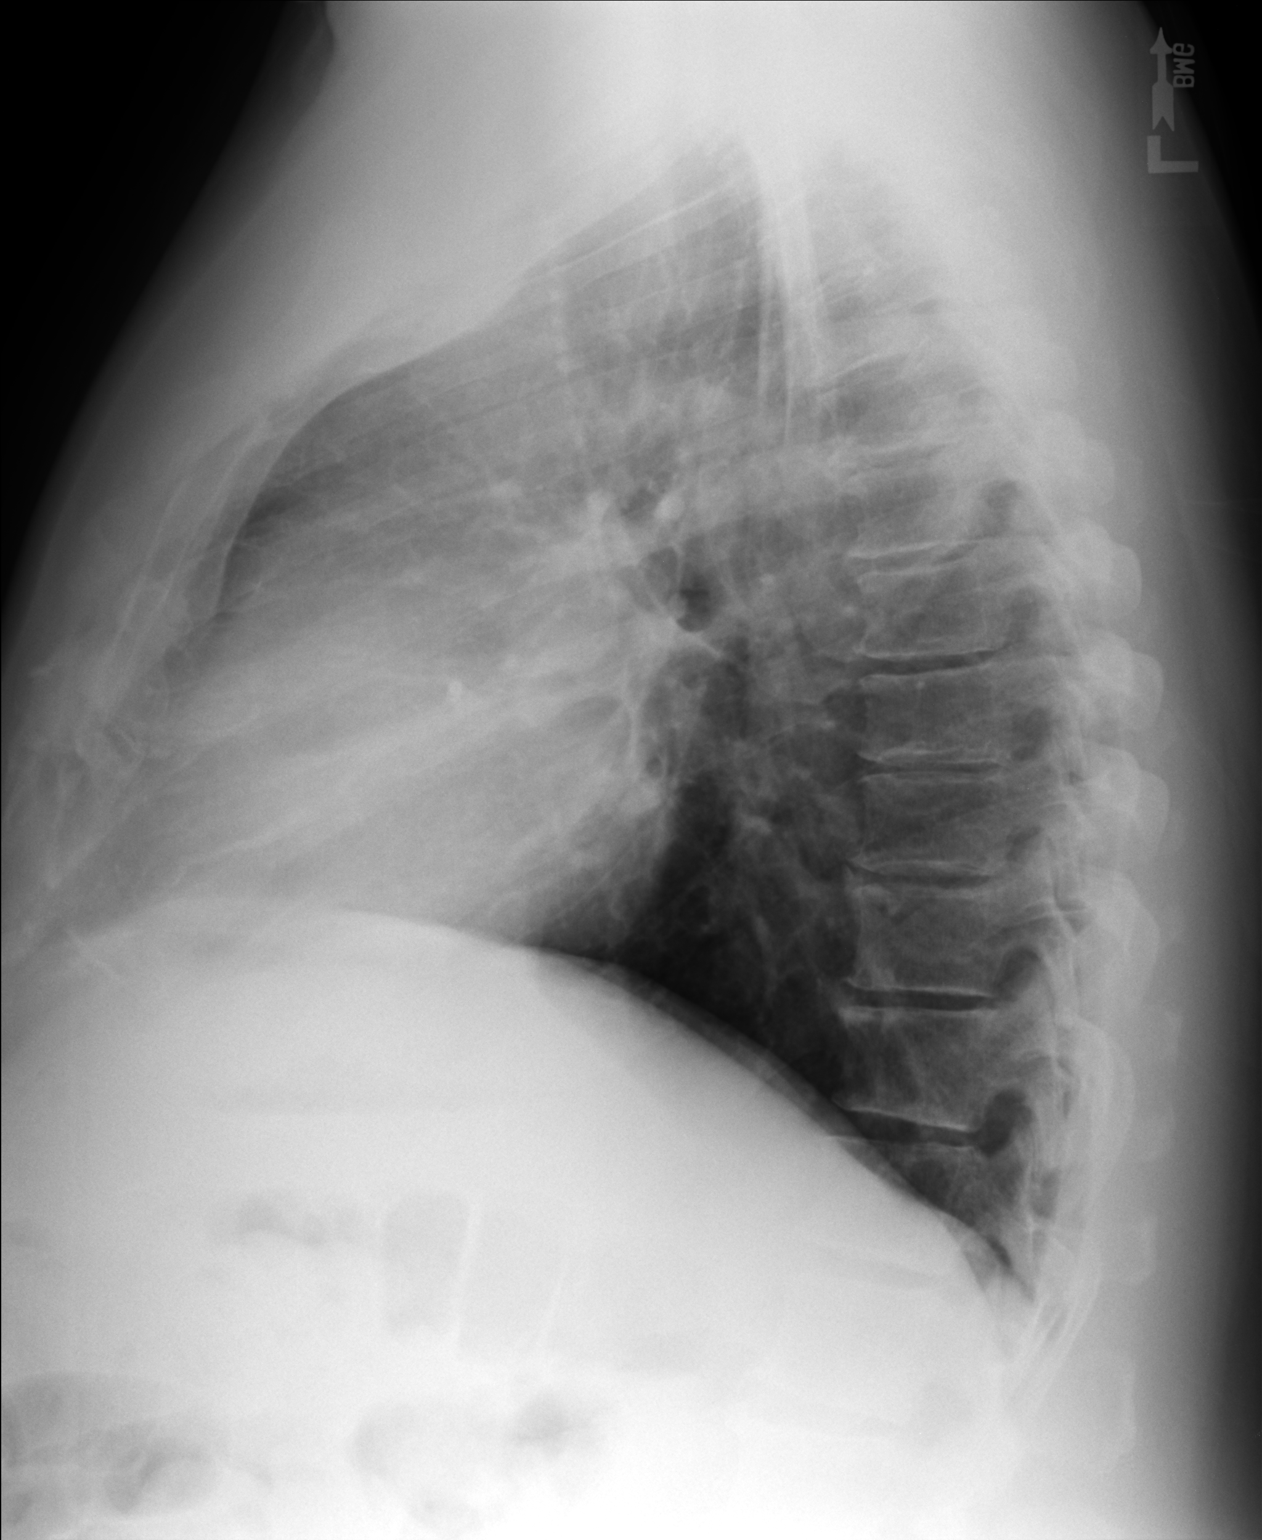

[2 of 2 positions shown; findings below may reference images not displayed]

FINDINGS: Cardiomediastinal silhouette is stable.  No acute
infiltrate or pleural effusion.  No pulmonary edema.  Bony thorax
is stable.
IMPRESSION: No active disease.  No significant change.

## 2013-07-22 IMAGING — CR DG TOE GREAT 2+V*R*
1 series · 1 of 1 positions shown · non-contrast
Comparison: Preliminary reading of Dr. Brm

CLINICAL DATA: Toe pain, history of gout

RIGHT GREAT TOE

[AP]
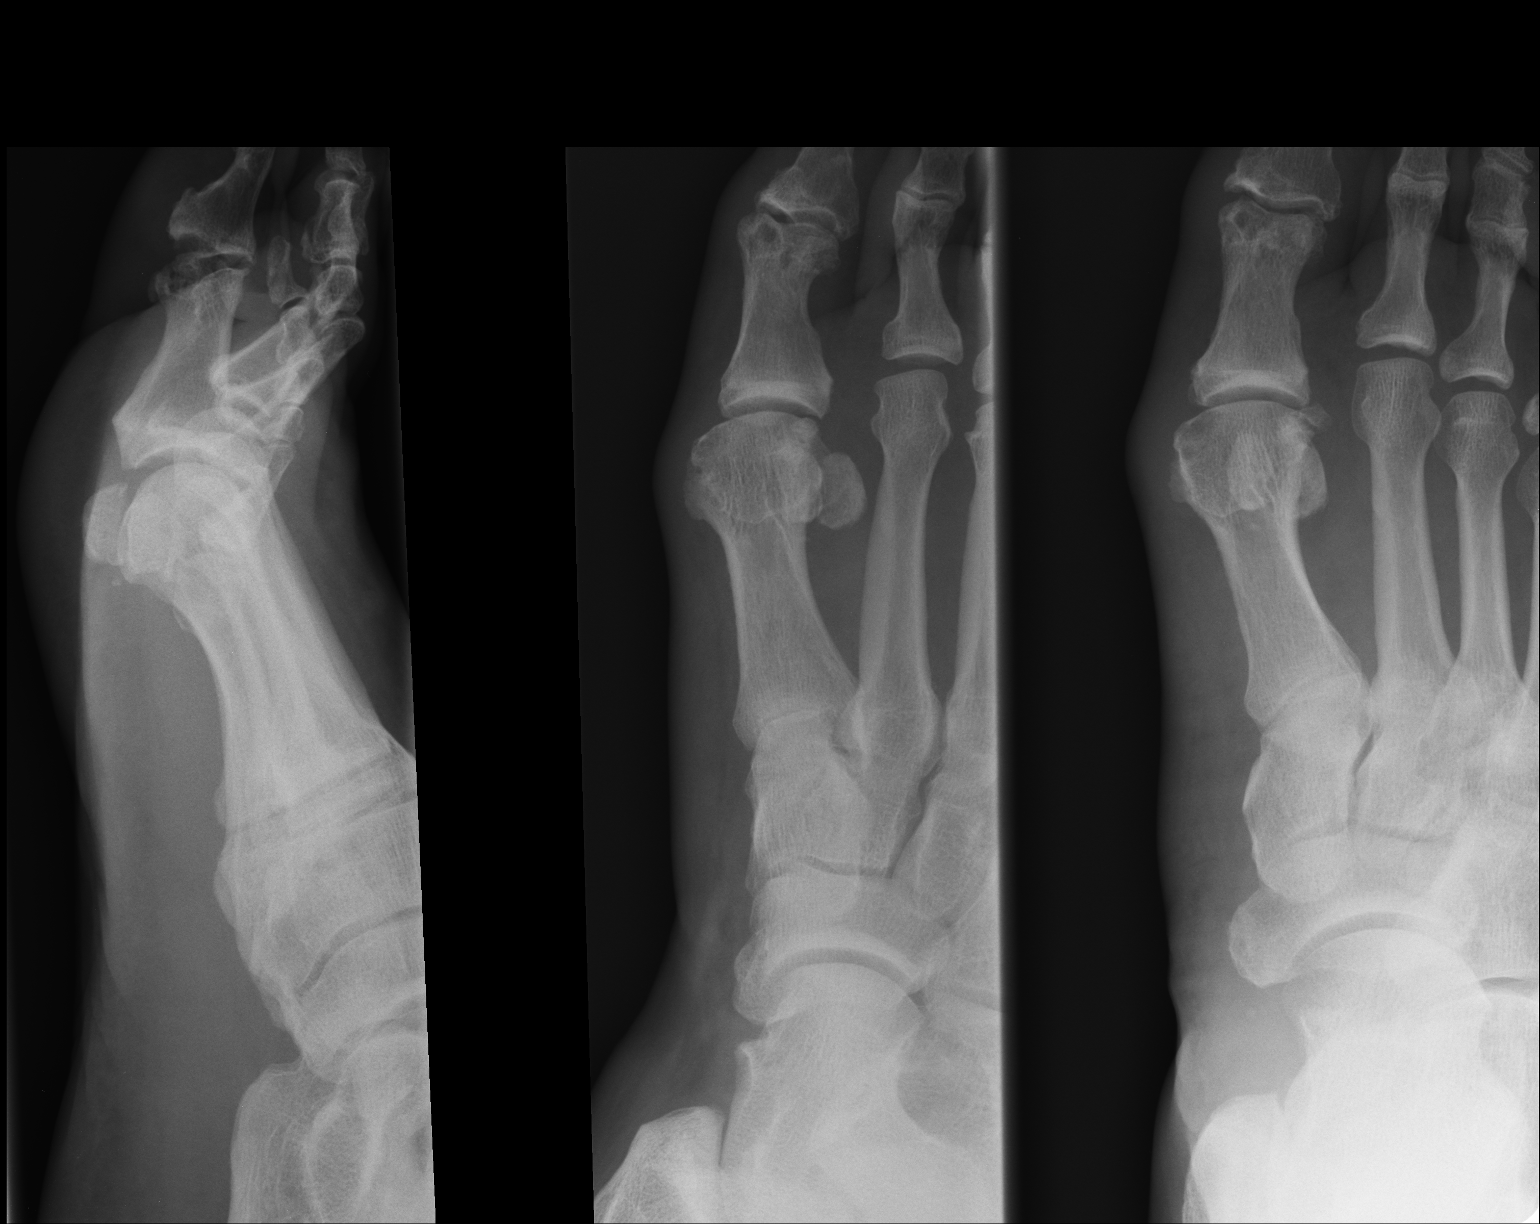

[1 of 1 positions shown; findings below may reference images not displayed]

FINDINGS: Three views of the right great toe submitted.  No acute
fracture or subluxation.  Mild degenerative changes first
interphalangeal joint.  Mild erosive changes distal first
metatarsal and distal aspect of proximal phalanx.
IMPRESSION: No acute fracture or subluxation.  Degenerative changes as
described above.

## 2013-08-02 ENCOUNTER — Other Ambulatory Visit (INDEPENDENT_AMBULATORY_CARE_PROVIDER_SITE_OTHER): Payer: 59

## 2013-08-02 ENCOUNTER — Ambulatory Visit (INDEPENDENT_AMBULATORY_CARE_PROVIDER_SITE_OTHER): Payer: 59 | Admitting: Internal Medicine

## 2013-08-02 ENCOUNTER — Encounter: Payer: Self-pay | Admitting: Internal Medicine

## 2013-08-02 ENCOUNTER — Encounter: Payer: Self-pay | Admitting: *Deleted

## 2013-08-02 VITALS — BP 128/70 | HR 70 | Temp 98.4°F | Wt 261.0 lb

## 2013-08-02 DIAGNOSIS — E291 Testicular hypofunction: Secondary | ICD-10-CM

## 2013-08-02 DIAGNOSIS — IMO0002 Reserved for concepts with insufficient information to code with codable children: Secondary | ICD-10-CM

## 2013-08-02 DIAGNOSIS — F3289 Other specified depressive episodes: Secondary | ICD-10-CM

## 2013-08-02 DIAGNOSIS — F329 Major depressive disorder, single episode, unspecified: Secondary | ICD-10-CM

## 2013-08-02 DIAGNOSIS — M169 Osteoarthritis of hip, unspecified: Secondary | ICD-10-CM

## 2013-08-02 DIAGNOSIS — IMO0001 Reserved for inherently not codable concepts without codable children: Secondary | ICD-10-CM

## 2013-08-02 DIAGNOSIS — R635 Abnormal weight gain: Secondary | ICD-10-CM

## 2013-08-02 DIAGNOSIS — M1610 Unilateral primary osteoarthritis, unspecified hip: Secondary | ICD-10-CM

## 2013-08-02 DIAGNOSIS — E1165 Type 2 diabetes mellitus with hyperglycemia: Secondary | ICD-10-CM

## 2013-08-02 DIAGNOSIS — M545 Low back pain, unspecified: Secondary | ICD-10-CM

## 2013-08-02 DIAGNOSIS — K219 Gastro-esophageal reflux disease without esophagitis: Secondary | ICD-10-CM

## 2013-08-02 DIAGNOSIS — D485 Neoplasm of uncertain behavior of skin: Secondary | ICD-10-CM

## 2013-08-02 DIAGNOSIS — D62 Acute posthemorrhagic anemia: Secondary | ICD-10-CM

## 2013-08-02 LAB — BASIC METABOLIC PANEL
BUN: 17 mg/dL (ref 6–23)
CO2: 31 mEq/L (ref 19–32)
Calcium: 9.6 mg/dL (ref 8.4–10.5)
Chloride: 104 mEq/L (ref 96–112)
Creatinine, Ser: 1.3 mg/dL (ref 0.4–1.5)
GFR: 58.29 mL/min — AB (ref >60.00)
Glucose, Bld: 100 mg/dL — ABNORMAL HIGH (ref 70–99)
Potassium: 5.1 mEq/L (ref 3.5–5.1)
Sodium: 140 mEq/L (ref 135–145)

## 2013-08-02 LAB — CBC WITH DIFFERENTIAL/PLATELET
Basophils Absolute: 0 10*3/uL (ref 0.0–0.1)
Basophils Relative: 0.4 % (ref 0.0–3.0)
EOS PCT: 2.4 % (ref 0.0–5.0)
Eosinophils Absolute: 0.2 10*3/uL (ref 0.0–0.7)
HCT: 51.3 % (ref 39.0–52.0)
Hemoglobin: 16.7 g/dL (ref 13.0–17.0)
Lymphocytes Relative: 18.7 % (ref 12.0–46.0)
Lymphs Abs: 1.7 10*3/uL (ref 0.7–4.0)
MCHC: 32.5 g/dL (ref 30.0–36.0)
MCV: 91.4 fl (ref 78.0–100.0)
MONO ABS: 1 10*3/uL (ref 0.1–1.0)
Monocytes Relative: 10.4 % (ref 3.0–12.0)
NEUTROS PCT: 68.1 % (ref 43.0–77.0)
Neutro Abs: 6.2 10*3/uL (ref 1.4–7.7)
PLATELETS: 233 10*3/uL (ref 150.0–400.0)
RBC: 5.61 Mil/uL (ref 4.22–5.81)
RDW: 14.4 % (ref 11.5–14.6)
WBC: 9.1 10*3/uL (ref 4.5–10.5)

## 2013-08-02 LAB — TESTOSTERONE: TESTOSTERONE: 981.09 ng/dL — AB (ref 350.00–890.00)

## 2013-08-02 LAB — HEMOGLOBIN A1C: Hgb A1c MFr Bld: 6 % (ref 4.6–6.5)

## 2013-08-02 LAB — HEPATIC FUNCTION PANEL
ALBUMIN: 4.3 g/dL (ref 3.5–5.2)
ALK PHOS: 51 U/L (ref 39–117)
ALT: 22 U/L (ref 0–53)
AST: 20 U/L (ref 0–37)
Bilirubin, Direct: 0 mg/dL (ref 0.0–0.3)
TOTAL PROTEIN: 7.8 g/dL (ref 6.0–8.3)
Total Bilirubin: 0.7 mg/dL (ref 0.3–1.2)

## 2013-08-02 LAB — TSH: TSH: 0.84 u[IU]/mL (ref 0.35–5.50)

## 2013-08-02 MED ORDER — TESTOSTERONE 10 MG/ACT (2%) TD GEL: 3.0000 | Freq: Every day | TRANSDERMAL | Status: DC

## 2013-08-02 NOTE — Assessment & Plan Note (Signed)
He stopped Hydrocodone 2/15

## 2013-08-02 NOTE — Progress Notes (Signed)
   Subjective:    HPI  F/u low testosterone - doing much better overall, fatigue - better, labs.  The patient presents for a follow-up of  chronic GERD, type 2 diabetes, OA/LBP - he stopped Vicodin (2/15)  F/u on low testosterone - feeling better on RX    C/o L hip pain - s/p THR by Dr Wynelle Link in 2014- better  BP Readings from Last 3 Encounters:  08/02/13 128/70  05/01/13 120/64  03/15/13 127/84   Wt Readings from Last 3 Encounters:  08/02/13 261 lb (118.389 kg)  05/01/13 251 lb (113.853 kg)  03/15/13 247 lb 3.2 oz (112.129 kg)      Review of Systems  Constitutional: Negative for diaphoresis, activity change and fatigue.  HENT: Negative for congestion.   Eyes: Negative for visual disturbance.  Respiratory: Negative for wheezing.   Gastrointestinal: Negative for blood in stool.  Genitourinary: Negative for decreased urine volume.  Musculoskeletal: Positive for arthralgias, back pain and gait problem. Negative for neck stiffness.       L hip severe pain and locking  Psychiatric/Behavioral: Negative for suicidal ideas. The patient is not nervous/anxious.    No CP, no SOB    Objective:   Physical Exam  Constitutional: He is oriented to person, place, and time. He appears well-developed. No distress.  Obese   HENT:  Mouth/Throat: Oropharynx is clear and moist.  Eyes: Conjunctivae are normal. Pupils are equal, round, and reactive to light.  Neck: Normal range of motion. No JVD present. No thyromegaly present.  Cardiovascular: Normal rate, regular rhythm, normal heart sounds and intact distal pulses.  Exam reveals no gallop and no friction rub.   No murmur heard. Pulmonary/Chest: Effort normal and breath sounds normal. No respiratory distress. He has no wheezes. He has no rales. He exhibits no tenderness.  Abdominal: Soft. Bowel sounds are normal. He exhibits no distension and no mass. There is no tenderness. There is no rebound and no guarding.  Musculoskeletal: Normal  range of motion. He exhibits tenderness (L hip w/ROM). He exhibits no edema.  Lymphadenopathy:    He has no cervical adenopathy.  Neurological: He is alert and oriented to person, place, and time. He has normal reflexes. No cranial nerve deficit. He exhibits normal muscle tone. Coordination normal.  Skin: Skin is warm and dry. No rash noted.  Psychiatric: He has a normal mood and affect. His behavior is normal. Judgment and thought content normal.  Mole on L cheek  BP 128/70  Pulse 70  Temp(Src) 98.4 F (36.9 C) (Oral)  Wt 261 lb (118.389 kg)  SpO2 96%   Lab Results  Component Value Date   WBC 9.8 02/20/2013   HGB 16.2 02/20/2013   HCT 46.8 02/20/2013   PLT 187 02/20/2013   GLUCOSE 118* 02/20/2013   CHOL 142 12/25/2012   TRIG 109.0 12/25/2012   HDL 27.10* 12/25/2012   LDLCALC 93 12/25/2012   ALT 20 12/25/2012   AST 18 12/25/2012   NA 138 02/20/2013   K 4.5 02/20/2013   CL 101 02/20/2013   CREATININE 1.13 02/20/2013   BUN 17 02/20/2013   CO2 25 02/20/2013   TSH 1.70 12/25/2012   PSA 0.81 10/21/2011   INR 0.93 04/14/2012   HGBA1C 6.1 12/25/2012       Assessment & Plan:

## 2013-08-02 NOTE — Assessment & Plan Note (Signed)
Doing good  

## 2013-08-02 NOTE — Patient Instructions (Signed)

## 2013-08-02 NOTE — Assessment & Plan Note (Signed)
Continue with current prescription therapy as reflected on the Med list.  

## 2013-08-02 NOTE — Progress Notes (Signed)
Pre-visit discussion using our clinic review tool. No additional management support is needed unless otherwise documented below in the visit note.  

## 2013-08-02 NOTE — Assessment & Plan Note (Signed)
2/15 L cheek Skin bx

## 2013-08-02 NOTE — Assessment & Plan Note (Signed)
Wt Readings from Last 3 Encounters:  08/02/13 261 lb (118.389 kg)  05/01/13 251 lb (113.853 kg)  03/15/13 247 lb 3.2 oz (112.129 kg)

## 2013-08-07 ENCOUNTER — Ambulatory Visit: Payer: 59 | Admitting: Internal Medicine

## 2013-08-15 ENCOUNTER — Encounter: Payer: Self-pay | Admitting: Internal Medicine

## 2013-08-15 ENCOUNTER — Other Ambulatory Visit: Payer: Self-pay | Admitting: Internal Medicine

## 2013-08-15 ENCOUNTER — Ambulatory Visit (INDEPENDENT_AMBULATORY_CARE_PROVIDER_SITE_OTHER): Payer: 59 | Admitting: Internal Medicine

## 2013-08-15 VITALS — BP 128/76 | HR 80 | Temp 98.3°F | Resp 16

## 2013-08-15 DIAGNOSIS — D489 Neoplasm of uncertain behavior, unspecified: Secondary | ICD-10-CM

## 2013-08-15 DIAGNOSIS — D485 Neoplasm of uncertain behavior of skin: Secondary | ICD-10-CM

## 2013-08-15 NOTE — Progress Notes (Signed)
Patient ID: Patrick Baldwin, male   DOB: 1950-08-18, 63 y.o.   MRN: [id number]   Procedure Note :     Procedure :  Skin biopsy   Indication:  Changing mole (s ),  Suspicious lesion(s)   Risks including unsuccessful procedure , bleeding, infection, bruising, scar, a need for another complete procedure and others were explained to the patient in detail as well as the benefits. Informed consent was obtained and signed.   The patient was placed in a decubitus position.  Lesion #1 on  L cheek   measuring  11x6   mm   Skin over lesion #1  was prepped with Betadine and alcohol  and anesthetized with 1 cc of 2% lidocaine and epinephrine, using a 25-gauge 1 inch needle.  Shave biopsy with a sterile Dermablade was carried out in the usual fashion. Hyfrecator was used to destroy the rest of the rest of the lesion potentially left behind and for hemostasis. We applied antibiotic ointment.    Lesion #2 on L cheek below #1    measuring 4x6  mm   Skin over lesion #2  was prepped with Betadine and alcohol  and anesthetized with 1 cc of 2% lidocaine and epinephrine, using a 25-gauge 1 inch needle.  Shave biopsy with a sterile Dermablade was carried out in the usual fashion. Hyfrecator was used to destroy the rest of the lesion potentially left behind and for hemostasis. Band-Aid was applied with antibiotic ointment.     Tolerated well. Complications none.      Postprocedure instructions :    A Band-Aid should be  changed twice daily. You can take a shower tomorrow.  Keep the wounds clean. You can wash them with liquid soap and water. Pat dry with gauze or a Kleenex tissue  Before applying antibiotic ointment and a Band-Aid.   You need to report immediately  if fever, chills or any signs of infection develop.    The biopsy results should be available in 1 -2 weeks.

## 2013-08-15 NOTE — Assessment & Plan Note (Signed)
2/15 L cheek x2 See procedure

## 2013-08-15 NOTE — Progress Notes (Signed)
Pre visit review using our clinic review tool, if applicable. No additional management support is needed unless otherwise documented below in the visit note. 

## 2013-10-04 ENCOUNTER — Other Ambulatory Visit (INDEPENDENT_AMBULATORY_CARE_PROVIDER_SITE_OTHER): Payer: 59

## 2013-10-04 ENCOUNTER — Encounter: Payer: Self-pay | Admitting: Internal Medicine

## 2013-10-04 ENCOUNTER — Ambulatory Visit (INDEPENDENT_AMBULATORY_CARE_PROVIDER_SITE_OTHER): Payer: 59 | Admitting: Internal Medicine

## 2013-10-04 VITALS — BP 126/80 | HR 80 | Temp 98.4°F | Resp 16 | Wt 258.0 lb

## 2013-10-04 DIAGNOSIS — D485 Neoplasm of uncertain behavior of skin: Secondary | ICD-10-CM

## 2013-10-04 DIAGNOSIS — M545 Low back pain, unspecified: Secondary | ICD-10-CM

## 2013-10-04 DIAGNOSIS — K429 Umbilical hernia without obstruction or gangrene: Secondary | ICD-10-CM

## 2013-10-04 LAB — URINALYSIS
Bilirubin Urine: NEGATIVE
Hgb urine dipstick: NEGATIVE
KETONES UR: NEGATIVE
Leukocytes, UA: NEGATIVE
Nitrite: NEGATIVE
SPECIFIC GRAVITY, URINE: 1.015 (ref 1.000–1.030)
TOTAL PROTEIN, URINE-UPE24: NEGATIVE
URINE GLUCOSE: NEGATIVE
UROBILINOGEN UA: 0.2 (ref 0.0–1.0)
pH: 6.5 (ref 5.0–8.0)

## 2013-10-04 NOTE — Assessment & Plan Note (Signed)
S/p cryo - resolved

## 2013-10-04 NOTE — Assessment & Plan Note (Signed)
?  UTI UA

## 2013-10-04 NOTE — Progress Notes (Deleted)
Pre visit review using our clinic review tool, if applicable. No additional management support is needed unless otherwise documented below in the visit note. 

## 2013-10-04 NOTE — Progress Notes (Signed)
Patient ID: Patrick Baldwin, male   DOB: 1950-08-03, 63 y.o.   MRN: [id number]   Subjective:    HPI C/o hernia C/o LBP/chill F/u L cheek skin lesion s/p cryo  F/u low testosterone - doing much better overall, fatigue - better, labs.  The patient presents for a follow-up of  chronic GERD, type 2 diabetes, OA/LBP - he stopped Vicodin (2/15)  F/u on low testosterone - feeling better on RX    C/o L hip pain - s/p THR by Dr Wynelle Link in 2014- better  BP Readings from Last 3 Encounters:  10/04/13 126/80  08/15/13 128/76  08/02/13 128/70   Wt Readings from Last 3 Encounters:  10/04/13 258 lb (117.028 kg)  08/02/13 261 lb (118.389 kg)  05/01/13 251 lb (113.853 kg)      Review of Systems  Constitutional: Negative for diaphoresis, activity change and fatigue.  HENT: Negative for congestion.   Eyes: Negative for visual disturbance.  Respiratory: Negative for wheezing.   Gastrointestinal: Negative for blood in stool.  Genitourinary: Negative for decreased urine volume.  Musculoskeletal: Positive for arthralgias, back pain and gait problem. Negative for neck stiffness.       L hip severe pain and locking  Psychiatric/Behavioral: Negative for suicidal ideas. The patient is not nervous/anxious.    No CP, no SOB    Objective:   Physical Exam  Constitutional: He is oriented to person, place, and time. He appears well-developed. No distress.  Obese   HENT:  Mouth/Throat: Oropharynx is clear and moist.  Eyes: Conjunctivae are normal. Pupils are equal, round, and reactive to light.  Neck: Normal range of motion. No JVD present. No thyromegaly present.  Cardiovascular: Normal rate, regular rhythm, normal heart sounds and intact distal pulses.  Exam reveals no gallop and no friction rub.   No murmur heard. Pulmonary/Chest: Effort normal and breath sounds normal. No respiratory distress. He has no wheezes. He has no rales. He exhibits no tenderness.  Abdominal: Soft. Bowel sounds are  normal. He exhibits no distension and no mass. There is no tenderness. There is no rebound and no guarding.  Musculoskeletal: Normal range of motion. He exhibits tenderness (L hip w/ROM). He exhibits no edema.  Lymphadenopathy:    He has no cervical adenopathy.  Neurological: He is alert and oriented to person, place, and time. He has normal reflexes. No cranial nerve deficit. He exhibits normal muscle tone. Coordination normal.  Skin: Skin is warm and dry. No rash noted.  Psychiatric: He has a normal mood and affect. His behavior is normal. Judgment and thought content normal.  Mole on L cheek - came off Small umbil hernia, NT 2 cm  BP 126/80  Pulse 80  Temp(Src) 98.4 F (36.9 C) (Oral)  Resp 16  Wt 258 lb (117.028 kg)   Lab Results  Component Value Date   WBC 9.1 08/02/2013   HGB 16.7 08/02/2013   HCT 51.3 08/02/2013   PLT 233.0 08/02/2013   GLUCOSE 100* 08/02/2013   CHOL 142 12/25/2012   TRIG 109.0 12/25/2012   HDL 27.10* 12/25/2012   LDLCALC 93 12/25/2012   ALT 22 08/02/2013   AST 20 08/02/2013   NA 140 08/02/2013   K 5.1 08/02/2013   CL 104 08/02/2013   CREATININE 1.3 08/02/2013   BUN 17 08/02/2013   CO2 31 08/02/2013   TSH 0.84 08/02/2013   PSA 0.81 10/21/2011   INR 0.93 04/14/2012   HGBA1C 6.0 08/02/2013       Assessment &  Plan:

## 2013-10-04 NOTE — Assessment & Plan Note (Signed)
Surg ref/consult

## 2013-10-05 ENCOUNTER — Other Ambulatory Visit: Payer: Self-pay | Admitting: Internal Medicine

## 2013-10-15 ENCOUNTER — Encounter (INDEPENDENT_AMBULATORY_CARE_PROVIDER_SITE_OTHER): Payer: Self-pay | Admitting: Surgery

## 2013-10-15 ENCOUNTER — Ambulatory Visit (INDEPENDENT_AMBULATORY_CARE_PROVIDER_SITE_OTHER): Payer: 59 | Admitting: Surgery

## 2013-10-15 VITALS — BP 133/84 | HR 82 | Temp 97.6°F | Resp 16 | Ht 70.0 in | Wt 257.6 lb

## 2013-10-15 DIAGNOSIS — K429 Umbilical hernia without obstruction or gangrene: Secondary | ICD-10-CM

## 2013-10-15 NOTE — Progress Notes (Signed)
Patient ID: Patrick Baldwin, male   DOB: 1950-12-20, 63 y.o.   MRN: [id number]  Chief Complaint  Patient presents with  . Hernia    HPI Patrick Baldwin is a 63 y.o. male.  Referred by Dr. Alain Marion for evaluation of umbilical hernia  HPI This is a 63 year old male who is status post excision of left flank lipoma in September of 2014 who presents with a four-week history of tenderness and protrusion at his umbilicus. The patient does not remember any initiating event.  This has become slightly more comfortable. He denies any obstructive symptoms. He would like to have this repaired while it is still small.   Past Medical History  Diagnosis Date  . Depression   . GERD (gastroesophageal reflux disease)   . Obesity   . ED (erectile dysfunction)   . OSA on CPAP   . Elevated glucose 2009  . Allergy     rhinitis  . Anxiety   . Low back pain   . Hx of colonic polyps     Dr Earlean Shawl  . Arthritis   . Diabetes mellitus without complication     Past Surgical History  Procedure Laterality Date  . Arthrosc l hip surgery  2009    Dr Ronnie Derby  . Tonsillectomy      as child  . Knee arthroscopy and arthrotomy  1970's  . Varicose vein surgery  1980's  . Upper gastrointestinal endoscopy    . Colonoscopy    . 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 Knee ligament reconstruction      removed cartliage  . Lipoma excision Left 02/28/2013    Procedure: EXCISION LIPOMA LEFT FLANK;  Surgeon: [name]. Georgette Dover, MD;  Location: [facility];  Service: General;  Laterality: Left;    Family History  Problem Relation Age of Onset  . Coronary artery disease Other   . Stroke Mother   . Heart disease Mother   . Hypertension Mother   . Diabetes Father     Social History History  Substance Use Topics  . Smoking status: Former Research scientist (life sciences)  . Smokeless tobacco: Former Systems developer     Quit date: 04/15/1999  . Alcohol Use: No    Allergies  Allergen Reactions  . Bupropion Hcl     REACTION: was grinding his teeth  . Cortisone Swelling and Other (See Comments)    Leg swelling,  fever    Current Outpatient Prescriptions  Medication Sig Dispense Refill  . aspirin 81 MG tablet Take 81 mg by mouth daily.      Marland Kitchen b complex vitamins tablet Take 1 tablet by mouth daily.      Marland Kitchen esomeprazole (NEXIUM) 40 MG capsule Take 1 capsule (40 mg total) by mouth daily before breakfast.  90 capsule  3  . glucosamine-chondroitin 500-400 MG tablet Take 1 tablet by mouth 3 (three) times daily.      . metFORMIN (GLUCOPHAGE) 500 MG tablet Take 1 tablet (500 mg total) by mouth daily with breakfast.  90 tablet  3  . RA VITAMIN D-3 1000 UNITS tablet take 1 tablet by mouth once daily  100 tablet  3  . Testosterone (FORTESTA) 10 MG/ACT (2%) GEL Place 3 Act onto the skin daily.  60 g  5  . TURMERIC PO Take by mouth.      Marland Kitchen VIAGRA 100 MG  tablet take 1 tablet by mouth once daily if needed  12 tablet  5   No current facility-administered medications for this visit.    Review of Systems Review of Systems  Constitutional: Negative for fever, chills and unexpected weight change.  HENT: Negative for congestion, hearing loss, sore throat, trouble swallowing and voice change.   Eyes: Negative for visual disturbance.  Respiratory: Negative for cough and wheezing.   Cardiovascular: Negative for chest pain, palpitations and leg swelling.  Gastrointestinal: Positive for abdominal pain. Negative for nausea, vomiting, diarrhea, constipation, blood in stool, abdominal distention, anal bleeding and rectal pain.  Genitourinary: Negative for hematuria and difficulty urinating.  Musculoskeletal: Negative for arthralgias.  Skin: Negative for rash and wound.  Neurological: Negative for seizures, syncope, weakness and headaches.  Hematological: Negative for adenopathy. Does not bruise/bleed easily.   Psychiatric/Behavioral: Negative for confusion.     Blood pressure 133/84, pulse 82, temperature 97.6 F (36.4 C), temperature source Temporal, resp. rate 16, height 5\' 10"  (1.778 m), weight 257 lb 9.6 oz (116.847 kg).  Physical Exam Physical Exam WDWN in NAD HEENT:  EOMI, sclera anicteric Neck:  No masses, no thyromegaly Lungs:  CTA bilaterally; normal respiratory effort CV:  Regular rate and rhythm; no murmurs Abd:  +bowel sounds, soft, small reducible umbilical hernia; 1.5 cm fascial defect Ext:  Well-perfused; no edema Skin:  Warm, dry; no sign of jaundice  Data Reviewed none  Assessment    Reducible umbilical hernia     Plan    Umbilical hernia repair with mesh.  The surgical procedure has been discussed with the patient.  Potential risks, benefits, alternative treatments, and expected outcomes have been explained.  All of the patient's questions at this time have been answered.  The likelihood of reaching the patient's treatment goal is good.  The patient understand the proposed surgical procedure and wishes to proceed.         [name]. Vennie Waymire 10/15/2013, 1:45 PM

## 2013-10-16 ENCOUNTER — Other Ambulatory Visit: Payer: Self-pay

## 2013-10-16 ENCOUNTER — Encounter (HOSPITAL_COMMUNITY): Payer: Self-pay

## 2013-10-16 ENCOUNTER — Encounter (HOSPITAL_COMMUNITY): Payer: Self-pay | Admitting: Pharmacy Technician

## 2013-10-16 ENCOUNTER — Encounter (HOSPITAL_COMMUNITY)
Admission: RE | Admit: 2013-10-16 | Discharge: 2013-10-16 | Disposition: A | Discharge status: Home / Self Care | Payer: 59 | Source: Ambulatory Visit | Attending: Surgery | Admitting: Surgery

## 2013-10-16 DIAGNOSIS — Z01810 Encounter for preprocedural cardiovascular examination: Secondary | ICD-10-CM | POA: Insufficient documentation

## 2013-10-16 DIAGNOSIS — Z01812 Encounter for preprocedural laboratory examination: Secondary | ICD-10-CM | POA: Insufficient documentation

## 2013-10-16 LAB — BASIC METABOLIC PANEL
BUN: 12 mg/dL (ref 6–23)
CALCIUM: 9.6 mg/dL (ref 8.4–10.5)
CO2: 28 meq/L (ref 19–32)
CREATININE: 1.21 mg/dL (ref 0.50–1.35)
Chloride: 101 mEq/L (ref 96–112)
GFR calc Af Amer: 72 mL/min — ABNORMAL LOW (ref >90)
GFR calc non Af Amer: 62 mL/min — ABNORMAL LOW (ref >90)
GLUCOSE: 116 mg/dL — AB (ref 70–99)
Potassium: 4.3 mEq/L (ref 3.7–5.3)
Sodium: 140 mEq/L (ref 137–147)

## 2013-10-16 LAB — CBC
HCT: 50.1 % (ref 39.0–52.0)
Hemoglobin: 16.5 g/dL (ref 13.0–17.0)
MCH: 29.3 pg (ref 26.0–34.0)
MCHC: 32.9 g/dL (ref 30.0–36.0)
MCV: 89 fL (ref 78.0–100.0)
PLATELETS: 201 10*3/uL (ref 150–400)
RBC: 5.63 MIL/uL (ref 4.22–5.81)
RDW: 13.6 % (ref 11.5–15.5)
WBC: 9 10*3/uL (ref 4.0–10.5)

## 2013-10-16 NOTE — Patient Instructions (Addendum)
20     Your procedure is scheduled BP:ZWCHENI 10/23/2013    Report to [facility] at  1100 AM.  Call this number if you have problems the night before or morning of surgery:  [phone]   Remember: Do not take any diabetic medications am of surgery!             IF YOU USE CPAP,BRING MASK AND TUBING AM OF SURGERY!             IF YOU DO NOT HAVE YOUR TYPE AND SCREEN DRAWN AT PRE-ADMIT APPOINTMENT, YOU WILL HAVE IT DRAWN AM OF SURGERY!   Do not eat food  AFTER MIDNIGHT!MAY HAVE CLEAR LIQUIDS FROM MIDNIGHT UP UNTIL 0700 AM THEN NOTHING UNTIL AFTER SURGERY!  Take these medicines the morning of surgery with A SIP OF WATER: Nexium    [facility] IS NOT RESPONSIBLE FOR ANY BELONGINGS OR VALUABLES BROUGHT TO HOSPITAL.  Marland Kitchen  Leave suitcase in the car. After surgery it may be brought to your room.  For patients admitted to the hospital, checkout time is 11:00 AM the day of              Discharge.    DO NOT WEAR JEWELRY,MAKE-UP,LOTIONS,POWDERS,PERFUMES,CONTACTS , DENTURES OR BRIDGEWORK ,AND DO NOT WEAR FALSE EYELASHES                                    Patients discharged the day of surgery will not be allowed to drive home. If going home the same day of surgery, must have someone stay with you first 24 hrs.at home and arrange for someone to drive you home from the [place]:   Special Instructions:              Please read over the following fact sheets that you were given:             1. [place].Tobin Chad     [phone]                              [facility] - Preparing for Surgery Before surgery, you can play an important role.  Because skin is not sterile, your skin needs to be as free of germs as possible.  You can reduce the number of germs on your skin by washing with CHG (chlorahexidine gluconate) soap before  surgery.  CHG is an antiseptic cleaner which kills germs and bonds with the skin to continue killing germs even after washing. Please DO NOT use if you have an allergy to CHG or antibacterial soaps.  If your skin becomes reddened/irritated stop using the CHG and inform your nurse when you arrive at Short Stay. Do not shave (including legs and underarms) for at least 48 hours prior to the first CHG shower.  You may shave your face. Please follow  these instructions carefully:  1.  Shower with CHG Soap the night before surgery and the  morning of Surgery.  2.  If you choose to wash your hair, wash your hair first as usual with your  normal  shampoo.  3.  After you shampoo, rinse your hair and body thoroughly to remove the  shampoo.                           4.  Use CHG as you would any other liquid soap.  You can apply chg directly  to the skin and wash                       Gently with a scrungie or clean washcloth.  5.  Apply the CHG Soap to your body ONLY FROM THE NECK DOWN.   Do not use on open                           Wound or open sores. Avoid contact with eyes, ears mouth and genitals (private parts).                        Genitals (private parts) with your normal soap.             6.  Wash thoroughly, paying special attention to the area where your surgery  will be performed.  7.  Thoroughly rinse your body with warm water from the neck down.  8.  DO NOT shower/wash with your normal soap after using and rinsing off  the CHG Soap.                9.  Pat yourself dry with a clean towel.            10.  Wear clean pajamas.            11.  Place clean sheets on your bed the night of your first shower and do not  sleep with pets. Day of Surgery : Do not apply any lotions/deodorants the morning of surgery.  Please wear clean clothes to the hospital/surgery center.  FAILURE TO FOLLOW THESE INSTRUCTIONS MAY RESULT IN THE CANCELLATION OF YOUR SURGERY PATIENT  SIGNATURE_________________________________  NURSE SIGNATURE__________________________________  ________________________________________________________________________   Adam Phenix  An incentive spirometer is a tool that can help keep your lungs clear and active. This tool measures how well you are filling your lungs with each breath. Taking long deep breaths may help reverse or decrease the chance of developing breathing (pulmonary) problems (especially infection) following:  A long period of time when you are unable to move or be active. BEFORE THE PROCEDURE   If the spirometer includes an indicator to show your best effort, your nurse or respiratory therapist will set it to a desired goal.  If possible, sit up straight or lean slightly forward. Try not to slouch.  Hold the incentive spirometer in an upright position. INSTRUCTIONS FOR USE  1. Sit on the edge of your bed if possible, or sit up as far as you can in bed or on a chair. 2. Hold the incentive spirometer in an upright position. 3. Breathe out normally. 4. Place the mouthpiece in your mouth and seal your lips tightly around it. 5. Breathe in slowly and as deeply as possible, raising the piston or the ball toward the top of the  column. 6. Hold your breath for 3-5 seconds or for as long as possible. Allow the piston or ball to fall to the bottom of the column. 7. Remove the mouthpiece from your mouth and breathe out normally. 8. Rest for a few seconds and repeat Steps 1 through 7 at least 10 times every 1-2 hours when you are awake. Take your time and take a few normal breaths between deep breaths. 9. The spirometer may include an indicator to show your best effort. Use the indicator as a goal to work toward during each repetition. 10. After each set of 10 deep breaths, practice coughing to be sure your lungs are clear. If you have an incision (the cut made at the time of surgery), support your incision when coughing  by placing a pillow or rolled up towels firmly against it. Once you are able to get out of bed, walk around indoors and cough well. You may stop using the incentive spirometer when instructed by your caregiver.  RISKS AND COMPLICATIONS  Take your time so you do not get dizzy or light-headed.  If you are in pain, you may need to take or ask for pain medication before doing incentive spirometry. It is harder to take a deep breath if you are having pain. AFTER USE  Rest and breathe slowly and easily.  It can be helpful to keep track of a log of your progress. Your caregiver can provide you with a simple table to help with this. If you are using the spirometer at home, follow these instructions: [place] IF:   You are having difficultly using the spirometer.  You have trouble using the spirometer as often as instructed.  Your pain medication is not giving enough relief while using the spirometer.  You develop fever of 100.5 F (38.1 C) or higher. SEEK IMMEDIATE MEDICAL CARE IF:   You cough up bloody sputum that had not been present before.  You develop fever of 102 F (38.9 C) or greater.  You develop worsening pain at or near the incision site. MAKE SURE YOU:   Understand these instructions.  Will watch your condition.  Will get help right away if you are not doing well or get worse. Document Released: 10/18/2006 Document Revised: 08/30/2011 Document Reviewed: 12/19/2006 [facility] Patient Information 2014 [place], [place].   ________________________________________________________________________

## 2013-10-23 ENCOUNTER — Encounter (HOSPITAL_COMMUNITY): Payer: Self-pay | Admitting: *Deleted

## 2013-10-23 ENCOUNTER — Ambulatory Visit (HOSPITAL_COMMUNITY)
Admission: RE | Admit: 2013-10-23 | Discharge: 2013-10-23 | Disposition: A | Discharge status: Home / Self Care | Payer: 59 | Source: Ambulatory Visit | Attending: Surgery | Admitting: Surgery

## 2013-10-23 ENCOUNTER — Ambulatory Visit (HOSPITAL_COMMUNITY): Payer: 59 | Admitting: Registered Nurse

## 2013-10-23 ENCOUNTER — Encounter (HOSPITAL_COMMUNITY)
Admission: RE | Disposition: A | Discharge status: Home / Self Care | Payer: Self-pay | Source: Ambulatory Visit | Attending: Surgery

## 2013-10-23 ENCOUNTER — Encounter (HOSPITAL_COMMUNITY): Payer: 59 | Admitting: Registered Nurse

## 2013-10-23 DIAGNOSIS — F411 Generalized anxiety disorder: Secondary | ICD-10-CM | POA: Insufficient documentation

## 2013-10-23 DIAGNOSIS — E119 Type 2 diabetes mellitus without complications: Secondary | ICD-10-CM | POA: Insufficient documentation

## 2013-10-23 DIAGNOSIS — F3289 Other specified depressive episodes: Secondary | ICD-10-CM | POA: Insufficient documentation

## 2013-10-23 DIAGNOSIS — M129 Arthropathy, unspecified: Secondary | ICD-10-CM | POA: Insufficient documentation

## 2013-10-23 DIAGNOSIS — K429 Umbilical hernia without obstruction or gangrene: Secondary | ICD-10-CM

## 2013-10-23 DIAGNOSIS — E669 Obesity, unspecified: Secondary | ICD-10-CM | POA: Insufficient documentation

## 2013-10-23 DIAGNOSIS — D649 Anemia, unspecified: Secondary | ICD-10-CM | POA: Insufficient documentation

## 2013-10-23 DIAGNOSIS — F329 Major depressive disorder, single episode, unspecified: Secondary | ICD-10-CM | POA: Insufficient documentation

## 2013-10-23 DIAGNOSIS — K219 Gastro-esophageal reflux disease without esophagitis: Secondary | ICD-10-CM | POA: Insufficient documentation

## 2013-10-23 DIAGNOSIS — Z888 Allergy status to other drugs, medicaments and biological substances status: Secondary | ICD-10-CM | POA: Insufficient documentation

## 2013-10-23 DIAGNOSIS — N529 Male erectile dysfunction, unspecified: Secondary | ICD-10-CM | POA: Insufficient documentation

## 2013-10-23 DIAGNOSIS — Z79899 Other long term (current) drug therapy: Secondary | ICD-10-CM | POA: Insufficient documentation

## 2013-10-23 DIAGNOSIS — G4733 Obstructive sleep apnea (adult) (pediatric): Secondary | ICD-10-CM | POA: Insufficient documentation

## 2013-10-23 DIAGNOSIS — I252 Old myocardial infarction: Secondary | ICD-10-CM | POA: Insufficient documentation

## 2013-10-23 DIAGNOSIS — Z7982 Long term (current) use of aspirin: Secondary | ICD-10-CM | POA: Insufficient documentation

## 2013-10-23 DIAGNOSIS — Z87891 Personal history of nicotine dependence: Secondary | ICD-10-CM | POA: Insufficient documentation

## 2013-10-23 DIAGNOSIS — I2510 Atherosclerotic heart disease of native coronary artery without angina pectoris: Secondary | ICD-10-CM | POA: Insufficient documentation

## 2013-10-23 HISTORY — PX: UMBILICAL HERNIA REPAIR: SHX196

## 2013-10-23 HISTORY — PX: INSERTION OF MESH: SHX5868

## 2013-10-23 LAB — GLUCOSE, CAPILLARY
Glucose-Capillary: 102 mg/dL — ABNORMAL HIGH (ref 70–99)
Glucose-Capillary: 108 mg/dL — ABNORMAL HIGH (ref 70–99)

## 2013-10-23 SURGERY — REPAIR, HERNIA, UMBILICAL, ADULT
Anesthesia: General | Site: Abdomen

## 2013-10-23 MED ORDER — LIDOCAINE HCL (CARDIAC) 20 MG/ML IV SOLN
INTRAVENOUS | Status: AC
  Filled 2013-10-23: qty 5

## 2013-10-23 MED ORDER — MORPHINE SULFATE 10 MG/ML IJ SOLN
2.0000 mg | INTRAMUSCULAR | Status: DC | PRN
Start: 2013-10-23 — End: 2013-10-23

## 2013-10-23 MED ORDER — PROPOFOL 10 MG/ML IV BOLUS
INTRAVENOUS | Status: AC
  Filled 2013-10-23: qty 20

## 2013-10-23 MED ORDER — OXYCODONE HCL 5 MG/5ML PO SOLN
5.0000 mg | Freq: Once | ORAL | Status: DC | PRN
  Filled 2013-10-23: qty 5

## 2013-10-23 MED ORDER — MEPERIDINE HCL 50 MG/ML IJ SOLN: 6.2500 mg | INTRAMUSCULAR | Status: DC | PRN

## 2013-10-23 MED ORDER — BUPIVACAINE HCL (PF) 0.25 % IJ SOLN
INTRAMUSCULAR | Status: AC
  Filled 2013-10-23: qty 30

## 2013-10-23 MED ORDER — PROMETHAZINE HCL 25 MG/ML IJ SOLN: 6.2500 mg | INTRAMUSCULAR | Status: DC | PRN

## 2013-10-23 MED ORDER — CHLORHEXIDINE GLUCONATE 4 % EX LIQD: 1.0000 "application " | Freq: Once | CUTANEOUS | Status: DC

## 2013-10-23 MED ORDER — LACTATED RINGERS IV SOLN
INTRAVENOUS | Status: DC | PRN
  Administered 2013-10-23: 12:00:00 via INTRAVENOUS

## 2013-10-23 MED ORDER — ONDANSETRON HCL 4 MG/2ML IJ SOLN: 4.0000 mg | INTRAMUSCULAR | Status: DC | PRN

## 2013-10-23 MED ORDER — ONDANSETRON HCL 4 MG/2ML IJ SOLN
INTRAMUSCULAR | Status: DC | PRN
  Administered 2013-10-23: 4 mg via INTRAVENOUS

## 2013-10-23 MED ORDER — HYDROMORPHONE HCL PF 1 MG/ML IJ SOLN
INTRAMUSCULAR | Status: AC
  Filled 2013-10-23: qty 1

## 2013-10-23 MED ORDER — OXYCODONE-ACETAMINOPHEN 5-325 MG PO TABS: 1.0000 | ORAL_TABLET | ORAL | Status: DC | PRN

## 2013-10-23 MED ORDER — PROPOFOL 10 MG/ML IV BOLUS
INTRAVENOUS | Status: DC | PRN
  Administered 2013-10-23: 200 mg via INTRAVENOUS

## 2013-10-23 MED ORDER — OXYCODONE HCL 5 MG PO TABS
5.0000 mg | ORAL_TABLET | Freq: Once | ORAL | Status: DC | PRN
Start: 2013-10-23 — End: 2013-10-23

## 2013-10-23 MED ORDER — CEFAZOLIN SODIUM-DEXTROSE 2-3 GM-% IV SOLR
INTRAVENOUS | Status: AC
  Filled 2013-10-23: qty 50

## 2013-10-23 MED ORDER — HYDROMORPHONE HCL PF 1 MG/ML IJ SOLN
0.2500 mg | INTRAMUSCULAR | Status: DC | PRN
  Administered 2013-10-23 (×2): 0.5 mg via INTRAVENOUS

## 2013-10-23 MED ORDER — BUPIVACAINE-EPINEPHRINE 0.25% -1:200000 IJ SOLN
INTRAMUSCULAR | Status: DC | PRN
  Administered 2013-10-23: 10 mL

## 2013-10-23 MED ORDER — CEFAZOLIN SODIUM-DEXTROSE 2-3 GM-% IV SOLR
2.0000 g | INTRAVENOUS | Status: AC
  Administered 2013-10-23: 2 g via INTRAVENOUS

## 2013-10-23 MED ORDER — BUPIVACAINE-EPINEPHRINE (PF) 0.25% -1:200000 IJ SOLN
INTRAMUSCULAR | Status: AC
  Filled 2013-10-23: qty 30

## 2013-10-23 MED ORDER — ONDANSETRON HCL 4 MG/2ML IJ SOLN
INTRAMUSCULAR | Status: AC
  Filled 2013-10-23: qty 2

## 2013-10-23 MED ORDER — SUFENTANIL CITRATE 50 MCG/ML IV SOLN
INTRAVENOUS | Status: DC | PRN
  Administered 2013-10-23: 5 ug via INTRAVENOUS
  Administered 2013-10-23: 10 ug via INTRAVENOUS

## 2013-10-23 MED ORDER — MIDAZOLAM HCL 2 MG/2ML IJ SOLN
INTRAMUSCULAR | Status: AC
  Filled 2013-10-23: qty 2

## 2013-10-23 MED ORDER — MIDAZOLAM HCL 5 MG/5ML IJ SOLN
INTRAMUSCULAR | Status: DC | PRN
  Administered 2013-10-23: 2 mg via INTRAVENOUS

## 2013-10-23 MED ORDER — LACTATED RINGERS IV SOLN
INTRAVENOUS | Status: DC
  Administered 2013-10-23: 1000 mL via INTRAVENOUS

## 2013-10-23 MED ORDER — OXYCODONE-ACETAMINOPHEN 5-325 MG PO TABS
1.0000 | ORAL_TABLET | ORAL | Status: DC | PRN
  Administered 2013-10-23: 1 via ORAL
  Filled 2013-10-23: qty 1

## 2013-10-23 MED ORDER — SUFENTANIL CITRATE 50 MCG/ML IV SOLN
INTRAVENOUS | Status: AC
  Filled 2013-10-23: qty 1

## 2013-10-23 SURGICAL SUPPLY — 39 items
BENZOIN TINCTURE PRP APPL 2/3 (GAUZE/BANDAGES/DRESSINGS) ×2 IMPLANT
BLADE HEX COATED 2.75 (ELECTRODE) ×2 IMPLANT
BLADE SURG 15 STRL LF DISP TIS (BLADE) ×1 IMPLANT
BLADE SURG 15 STRL SS (BLADE) ×1
DECANTER SPIKE VIAL GLASS SM (MISCELLANEOUS) ×2 IMPLANT
DRAPE LAPAROTOMY T 102X78X121 (DRAPES) ×2 IMPLANT
DRAPE UTILITY XL STRL (DRAPES) ×2 IMPLANT
DRSG TEGADERM 4X4.75 (GAUZE/BANDAGES/DRESSINGS) ×2 IMPLANT
ELECT REM PT RETURN 9FT ADLT (ELECTROSURGICAL) ×2
ELECTRODE REM PT RTRN 9FT ADLT (ELECTROSURGICAL) ×1 IMPLANT
GAUZE SPONGE 2X2 8PLY STRL LF (GAUZE/BANDAGES/DRESSINGS) ×1 IMPLANT
GLOVE BIO SURGEON STRL SZ7 (GLOVE) ×2 IMPLANT
GLOVE BIOGEL PI IND STRL 7.0 (GLOVE) IMPLANT
GLOVE BIOGEL PI IND STRL 7.5 (GLOVE) ×1 IMPLANT
GLOVE BIOGEL PI INDICATOR 7.0 (GLOVE)
GLOVE BIOGEL PI INDICATOR 7.5 (GLOVE) ×1
GOWN STRL REUS W/TWL LRG LVL3 (GOWN DISPOSABLE) ×4 IMPLANT
GOWN STRL REUS W/TWL XL LVL3 (GOWN DISPOSABLE) ×2 IMPLANT
KIT BASIN OR (CUSTOM PROCEDURE TRAY) ×2 IMPLANT
MESH VENTRALEX ST 1-7/10 CRC S (Mesh General) ×2 IMPLANT
NEEDLE HYPO 22GX1.5 SAFETY (NEEDLE) ×2 IMPLANT
NEEDLE HYPO 25X1 1.5 SAFETY (NEEDLE) IMPLANT
NS IRRIG 1000ML POUR BTL (IV SOLUTION) ×2 IMPLANT
PACK BASIC VI WITH GOWN DISP (CUSTOM PROCEDURE TRAY) ×2 IMPLANT
PENCIL BUTTON HOLSTER BLD 10FT (ELECTRODE) ×2 IMPLANT
SPONGE GAUZE 2X2 STER 10/PKG (GAUZE/BANDAGES/DRESSINGS) ×1
SPONGE GAUZE 4X4 12PLY (GAUZE/BANDAGES/DRESSINGS) IMPLANT
SPONGE LAP 4X18 X RAY DECT (DISPOSABLE) ×4 IMPLANT
STRIP CLOSURE SKIN 1/2X4 (GAUZE/BANDAGES/DRESSINGS) ×2 IMPLANT
SUT MNCRL AB 4-0 PS2 18 (SUTURE) ×2 IMPLANT
SUT NOVA NAB DX-16 0-1 5-0 T12 (SUTURE) ×2 IMPLANT
SUT NOVA NAB GS-21 0 18 T12 DT (SUTURE) ×2 IMPLANT
SUT PROLENE 0 CT 1 CR/8 (SUTURE) IMPLANT
SUT PROLENE 0 CT 2 (SUTURE) IMPLANT
SUT VIC AB 3-0 SH 27 (SUTURE)
SUT VIC AB 3-0 SH 27X BRD (SUTURE) IMPLANT
SUT VIC AB 3-0 SH 27XBRD (SUTURE) IMPLANT
SYR CONTROL 10ML LL (SYRINGE) ×2 IMPLANT
TOWEL OR 17X26 10 PK STRL BLUE (TOWEL DISPOSABLE) ×2 IMPLANT

## 2013-10-23 NOTE — Anesthesia Preprocedure Evaluation (Signed)
Anesthesia Evaluation  Patient identified by MRN, date of birth, ID band Patient awake    Reviewed: Allergy & Precautions, H&P , NPO status , Patient's Chart, lab work & pertinent test results  Airway Mallampati: II TM Distance: >3 FB Neck ROM: Full    Dental  (+) Dental Advisory Given, Teeth Intact   Pulmonary sleep apnea and Continuous Positive Airway Pressure Ventilation , former smoker,  breath sounds clear to auscultation- rhonchi  Pulmonary exam normal       Cardiovascular - CAD and - Past MI Rhythm:Regular Rate:Normal     Neuro/Psych PSYCHIATRIC DISORDERS Anxiety Depression negative neurological ROS     GI/Hepatic Neg liver ROS, GERD-  Medicated,  Endo/Other  diabetes, Type 2, Oral Hypoglycemic Agents  Renal/GU negative Renal ROS     Musculoskeletal negative musculoskeletal ROS (+)   Abdominal (+) + obese,   Peds  Hematology negative hematology ROS (+) anemia ,   Anesthesia Other Findings   Reproductive/Obstetrics                           Anesthesia Physical  Anesthesia Plan  ASA: III  Anesthesia Plan: General   Post-op Pain Management:    Induction: Intravenous  Airway Management Planned: Oral ETT  Additional Equipment:   Intra-op Plan:   Post-operative Plan: Extubation in OR  Informed Consent: I have reviewed the patients History and Physical, chart, labs and discussed the procedure including the risks, benefits and alternatives for the proposed anesthesia with the patient or authorized representative who has indicated his/her understanding and acceptance.   Dental advisory given  Plan Discussed with: CRNA  Anesthesia Plan Comments:         Anesthesia Quick Evaluation

## 2013-10-23 NOTE — Transfer of Care (Signed)
Immediate Anesthesia Transfer of Care Note  Patient: Patrick Baldwin  Procedure(s) Performed: Procedure(s): HERNIA REPAIR UMBILICAL ADULT (N/A) INSERTION OF MESH (N/A)  Patient Location: PACU  Anesthesia Type:General  Level of Consciousness: awake, sedated and patient cooperative  Airway & Oxygen Therapy: Patient Spontanous Breathing and Patient connected to face mask oxygen  Post-op Assessment: Report given to PACU RN and Post -op Vital signs reviewed and stable  Post vital signs: Reviewed and stable  Complications: No apparent anesthesia complications

## 2013-10-23 NOTE — Discharge Instructions (Signed)
[facility] ° °UMBILICAL HERNIA REPAIR: POST OP INSTRUCTIONS ° °Always review your discharge instruction sheet given to you by the facility where your surgery was performed. °IF YOU HAVE DISABILITY OR FAMILY LEAVE FORMS, YOU MUST BRING THEM TO THE OFFICE FOR PROCESSING.   °DO NOT GIVE THEM TO YOUR DOCTOR. ° °1. A  prescription for pain medication may be given to you upon discharge.  Take your pain medication as prescribed, if needed.  If narcotic pain medicine is not needed, then you may take acetaminophen (Tylenol) or ibuprofen (Advil) as needed. °2. Take your usually prescribed medications unless otherwise directed. °3. If you need a refill on your pain medication, please contact your pharmacy.  They will contact our office to request authorization. Prescriptions will not be filled after 5 pm or on week-ends. °4. You should follow a light diet the first 24 hours after arrival home, such as soup and crackers, etc.  Be sure to include lots of fluids daily.  Resume your normal diet the day after surgery. °5. Most patients will experience some swelling and bruising around the umbilicus or in the groin and scrotum.  Ice packs and reclining will help.  Swelling and bruising can take several days to resolve.  °6. It is common to experience some constipation if taking pain medication after surgery.  Increasing fluid intake and taking a stool softener (such as Colace) will usually help or prevent this problem from occurring.  A mild laxative (Milk of Magnesia or Miralax) should be taken according to package directions if there are no bowel movements after 48 hours. °7. Unless discharge instructions indicate otherwise, you may remove your bandages 24-48 hours after surgery, and you may shower at that time.  You will have steri-strips (small skin tapes) in place directly over the incision.  These strips should be left on the skin for 7-10 days. °8. ACTIVITIES:  You may resume regular (light) daily activities  beginning the next day--such as daily self-care, walking, climbing stairs--gradually increasing activities as tolerated.  You may have sexual intercourse when it is comfortable.  Refrain from any heavy lifting or straining until approved by your doctor. °a. You may drive when you are no longer taking prescription pain medication, you can comfortably wear a seatbelt, and you can safely maneuver your car and apply brakes. °b. RETURN TO WORK:  2-3 weeks with light duty - no lifting over 15 lbs. °9. You should see your doctor in the office for a follow-up appointment approximately 2-3 weeks after your surgery.  Make sure that you call for this appointment within a day or two after you arrive home to insure a convenient appointment time. °10. OTHER INSTRUCTIONS:  __________________________________________________________________________________________________________________________________________________________________________________________  °WHEN TO CALL YOUR DOCTOR: °1. Fever over 101.0 °2. Inability to urinate °3. Nausea and/or vomiting °4. Extreme swelling or bruising °5. Continued bleeding from incision. °6. Increased pain, redness, or drainage from the incision ° °The clinic staff is available to answer your questions during regular business hours.  Please don’t hesitate to call and ask to speak to one of the nurses for clinical concerns.  If you have a medical emergency, go to the nearest emergency room or call 911.  A surgeon from [facility] is always on call at the hospital ° ° °[address], [address], [place], [place]  [address] ? ° [address] [address], , [place]   [address] °[phone]    [phone]    FAX [phone] °Web site: [email or web address] ° ° °

## 2013-10-23 NOTE — Interval H&P Note (Signed)
History and Physical Interval Note:  10/23/2013 10:55 AM  Patrick Baldwin  has presented today for surgery, with the diagnosis of UMBILICAL HERNIA   The various methods of treatment have been discussed with the patient and family. After consideration of risks, benefits and other options for treatment, the patient has consented to  Procedure(s): HERNIA REPAIR UMBILICAL ADULT (N/A) INSERTION OF MESH (N/A) as a surgical intervention .  The patient's history has been reviewed, patient examined, no change in status, stable for surgery.  I have reviewed the patient's chart and labs.  Questions were answered to the patient's satisfaction.     [name]. Jamar Weatherall

## 2013-10-23 NOTE — H&P (View-Only) (Signed)
Patient ID: Patrick Baldwin, male   DOB: 1950-12-20, 63 y.o.   MRN: [id number]  Chief Complaint  Patient presents with  . Hernia    HPI Patrick Baldwin is a 63 y.o. male.  Referred by Dr. Alain Marion for evaluation of umbilical hernia  HPI This is a 63 year old male who is status post excision of left flank lipoma in September of 2014 who presents with a four-week history of tenderness and protrusion at his umbilicus. The patient does not remember any initiating event.  This has become slightly more comfortable. He denies any obstructive symptoms. He would like to have this repaired while it is still small.   Past Medical History  Diagnosis Date  . Depression   . GERD (gastroesophageal reflux disease)   . Obesity   . ED (erectile dysfunction)   . OSA on CPAP   . Elevated glucose 2009  . Allergy     rhinitis  . Anxiety   . Low back pain   . Hx of colonic polyps     Dr Earlean Shawl  . Arthritis   . Diabetes mellitus without complication     Past Surgical History  Procedure Laterality Date  . Arthrosc l hip surgery  2009    Dr Ronnie Derby  . Tonsillectomy      as child  . Knee arthroscopy and arthrotomy  1970's  . Varicose vein surgery  1980's  . Upper gastrointestinal endoscopy    . Colonoscopy    . 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 Knee ligament reconstruction      removed cartliage  . Lipoma excision Left 02/28/2013    Procedure: EXCISION LIPOMA LEFT FLANK;  Surgeon: [name]. Georgette Dover, MD;  Location: [facility];  Service: General;  Laterality: Left;    Family History  Problem Relation Age of Onset  . Coronary artery disease Other   . Stroke Mother   . Heart disease Mother   . Hypertension Mother   . Diabetes Father     Social History History  Substance Use Topics  . Smoking status: Former Research scientist (life sciences)  . Smokeless tobacco: Former Systems developer     Quit date: 04/15/1999  . Alcohol Use: No    Allergies  Allergen Reactions  . Bupropion Hcl     REACTION: was grinding his teeth  . Cortisone Swelling and Other (See Comments)    Leg swelling,  fever    Current Outpatient Prescriptions  Medication Sig Dispense Refill  . aspirin 81 MG tablet Take 81 mg by mouth daily.      Marland Kitchen b complex vitamins tablet Take 1 tablet by mouth daily.      Marland Kitchen esomeprazole (NEXIUM) 40 MG capsule Take 1 capsule (40 mg total) by mouth daily before breakfast.  90 capsule  3  . glucosamine-chondroitin 500-400 MG tablet Take 1 tablet by mouth 3 (three) times daily.      . metFORMIN (GLUCOPHAGE) 500 MG tablet Take 1 tablet (500 mg total) by mouth daily with breakfast.  90 tablet  3  . RA VITAMIN D-3 1000 UNITS tablet take 1 tablet by mouth once daily  100 tablet  3  . Testosterone (FORTESTA) 10 MG/ACT (2%) GEL Place 3 Act onto the skin daily.  60 g  5  . TURMERIC PO Take by mouth.      Marland Kitchen VIAGRA 100 MG  tablet take 1 tablet by mouth once daily if needed  12 tablet  5   No current facility-administered medications for this visit.    Review of Systems Review of Systems  Constitutional: Negative for fever, chills and unexpected weight change.  HENT: Negative for congestion, hearing loss, sore throat, trouble swallowing and voice change.   Eyes: Negative for visual disturbance.  Respiratory: Negative for cough and wheezing.   Cardiovascular: Negative for chest pain, palpitations and leg swelling.  Gastrointestinal: Positive for abdominal pain. Negative for nausea, vomiting, diarrhea, constipation, blood in stool, abdominal distention, anal bleeding and rectal pain.  Genitourinary: Negative for hematuria and difficulty urinating.  Musculoskeletal: Negative for arthralgias.  Skin: Negative for rash and wound.  Neurological: Negative for seizures, syncope, weakness and headaches.  Hematological: Negative for adenopathy. Does not bruise/bleed easily.   Psychiatric/Behavioral: Negative for confusion.     Blood pressure 133/84, pulse 82, temperature 97.6 F (36.4 C), temperature source Temporal, resp. rate 16, height 5\' 10"  (1.778 m), weight 257 lb 9.6 oz (116.847 kg).  Physical Exam Physical Exam WDWN in NAD HEENT:  EOMI, sclera anicteric Neck:  No masses, no thyromegaly Lungs:  CTA bilaterally; normal respiratory effort CV:  Regular rate and rhythm; no murmurs Abd:  +bowel sounds, soft, small reducible umbilical hernia; 1.5 cm fascial defect Ext:  Well-perfused; no edema Skin:  Warm, dry; no sign of jaundice  Data Reviewed none  Assessment    Reducible umbilical hernia     Plan    Umbilical hernia repair with mesh.  The surgical procedure has been discussed with the patient.  Potential risks, benefits, alternative treatments, and expected outcomes have been explained.  All of the patient's questions at this time have been answered.  The likelihood of reaching the patient's treatment goal is good.  The patient understand the proposed surgical procedure and wishes to proceed.         [name]. Irena Gaydos 10/15/2013, 1:45 PM

## 2013-10-23 NOTE — Op Note (Signed)
Indications:  The patient presented with a history of an enlarging, reducible umbilical hernia.  The patient was examined and we recommended umbilical hernia repair with mesh.  Pre-operative diagnosis:  Umbilical hernia  Post-operative diagnosis:  Same  Surgeon: [name]. Shjon Lizarraga   Assistants: Byrd Hesselbach RNFA  Anesthesia: General LMA anesthesia  ASA Class: 1   Procedure Details  The patient was seen again in the Holding Room. The risks, benefits, complications, treatment options, and expected outcomes were discussed with the patient. The possibilities of reaction to medication, pulmonary aspiration, perforation of viscus, bleeding, recurrent infection, the need for additional procedures, and development of a complication requiring transfusion or further operation were discussed with the patient and/or family. There was concurrence with the proposed plan, and informed consent was obtained. The site of surgery was properly noted/marked. The patient was taken to the Operating Room, identified as Patrick Baldwin, and the procedure verified as umbilical hernia repair. A Time Out was held and the above information confirmed.  After an adequate level of general anesthesia was obtained, the patient's abdomen was prepped with Chloraprep and draped in sterile fashion.  We made a transverse incision above the umbilicus.  Dissection was carried down to the hernia sac with cautery.  We dissected bluntly around the hernia sac down to the edge of the fascial defect.  We reduced the hernia sac back into the pre-peritoneal space.  The fascial defect measured 1.5 cm.  We cleared the fascia in all directions.  A small Ventralex mesh was inserted into the pre-peritoneal space and was deployed.  The mesh was secured with four trans-fascial sutures of 0 Novofil.  The fascial defect was closed with multiple interrupted figure-of-eight 1 Novofil sutures.  The base of the umbilicus was tacked down with 3-0 Vicryl.  3-0 Vicryl  was used to close the subcutaneous tissues and 4-0 Monocryl was used to close the skin.  Steri-strips and clean dressing were applied.  The patient was extubated and brought to the recovery room in stable condition.  All sponge, instrument, and needle counts were correct prior to closure and at the conclusion of the case.   Estimated Blood Loss: Minimal          Complications: None; patient tolerated the procedure well.         Disposition: PACU - hemodynamically stable.         Condition: stable  [name]. Georgette Dover, MD, [facility]  General/ Trauma Surgery  10/23/2013 2:07 PM

## 2013-10-24 ENCOUNTER — Telehealth (INDEPENDENT_AMBULATORY_CARE_PROVIDER_SITE_OTHER): Payer: Self-pay

## 2013-10-24 NOTE — Anesthesia Postprocedure Evaluation (Signed)
Anesthesia Post Note  Patient: Patrick Baldwin  Procedure(s) Performed: Procedure(s) (LRB): HERNIA REPAIR UMBILICAL ADULT (N/A) INSERTION OF MESH (N/A)  Anesthesia type: General  Patient location: PACU  Post pain: Pain level controlled  Post assessment: Post-op Vital signs reviewed  Last Vitals: BP 158/81  Pulse 58  Temp(Src) 36.4 C (Oral)  Resp 16  SpO2 100%  Post vital signs: Reviewed  Level of consciousness: sedated  Complications: No apparent anesthesia complications

## 2013-10-24 NOTE — Telephone Encounter (Signed)
Pt s/p hernia repair on 10/23/13. Pt was calling to let us know his incision was bleeding. Instructed him to replace bandage and continue to watch the bleeding. Pt also states that he has not had a BM. Informed him that this was normal after surgery it had not been greater than 48 hours. Pt verbalized understanding and will call us back if anything changed.

## 2013-10-25 ENCOUNTER — Encounter (HOSPITAL_COMMUNITY): Payer: Self-pay | Admitting: Surgery

## 2013-11-13 ENCOUNTER — Ambulatory Visit (INDEPENDENT_AMBULATORY_CARE_PROVIDER_SITE_OTHER): Payer: 59 | Admitting: Surgery

## 2013-11-13 ENCOUNTER — Encounter (INDEPENDENT_AMBULATORY_CARE_PROVIDER_SITE_OTHER): Payer: Self-pay | Admitting: Surgery

## 2013-11-13 DIAGNOSIS — K429 Umbilical hernia without obstruction or gangrene: Secondary | ICD-10-CM

## 2013-11-13 NOTE — Progress Notes (Signed)
Status post umbilical hernia repair with mesh on 10/23/13. He had a 1.5 cm defect repaired with a small ventral X. Mesh. He is doing quite well. Appetite and bowel movements are normal. He has some hard scar tissue under his incision but otherwise it seems to be healing well. He gets occasional twinges of pain but these are very transient. He may slowly begin increasing his level of activity and return to full activity within the next week. Followup as needed.  Patrick Baldwin. Georgette Dover, MD, [facility]  General/ Trauma Surgery  11/13/2013 10:34 AM

## 2013-11-19 ENCOUNTER — Telehealth: Payer: Self-pay | Admitting: *Deleted

## 2013-11-19 NOTE — Telephone Encounter (Signed)
OK to fill this prescription with additional refills x5 Thank you!  

## 2013-11-19 NOTE — Telephone Encounter (Signed)
Rf req for Testosterone 10 mg gel pump. Apply 3 pumps daily. Last filled 10/16/13. Ok to Rf?

## 2013-11-20 MED ORDER — TESTOSTERONE 10 MG/ACT (2%) TD GEL: 30.0000 mg | Freq: Every day | TRANSDERMAL | Status: DC

## 2013-11-20 NOTE — Telephone Encounter (Signed)
Done

## 2014-01-16 ENCOUNTER — Other Ambulatory Visit: Payer: Self-pay | Admitting: Internal Medicine

## 2014-01-31 ENCOUNTER — Ambulatory Visit (INDEPENDENT_AMBULATORY_CARE_PROVIDER_SITE_OTHER): Payer: 59 | Admitting: Internal Medicine

## 2014-01-31 ENCOUNTER — Encounter: Payer: Self-pay | Admitting: Internal Medicine

## 2014-01-31 ENCOUNTER — Other Ambulatory Visit (INDEPENDENT_AMBULATORY_CARE_PROVIDER_SITE_OTHER): Payer: 59

## 2014-01-31 VITALS — BP 130/70 | HR 76 | Temp 98.4°F | Resp 16 | Wt 256.0 lb

## 2014-01-31 DIAGNOSIS — IMO0001 Reserved for inherently not codable concepts without codable children: Secondary | ICD-10-CM

## 2014-01-31 DIAGNOSIS — E1165 Type 2 diabetes mellitus with hyperglycemia: Secondary | ICD-10-CM

## 2014-01-31 DIAGNOSIS — K429 Umbilical hernia without obstruction or gangrene: Secondary | ICD-10-CM

## 2014-01-31 DIAGNOSIS — M545 Low back pain, unspecified: Secondary | ICD-10-CM

## 2014-01-31 DIAGNOSIS — IMO0002 Reserved for concepts with insufficient information to code with codable children: Secondary | ICD-10-CM

## 2014-01-31 LAB — BASIC METABOLIC PANEL
BUN: 13 mg/dL (ref 6–23)
CO2: 31 mEq/L (ref 19–32)
Calcium: 9.8 mg/dL (ref 8.4–10.5)
Chloride: 101 mEq/L (ref 96–112)
Creatinine, Ser: 1.4 mg/dL (ref 0.4–1.5)
GFR: 54.83 mL/min — ABNORMAL LOW (ref >60.00)
GLUCOSE: 99 mg/dL (ref 70–99)
Potassium: 5.3 mEq/L — ABNORMAL HIGH (ref 3.5–5.1)
Sodium: 138 mEq/L (ref 135–145)

## 2014-01-31 LAB — HEMOGLOBIN A1C: Hgb A1c MFr Bld: 6 % (ref 4.6–6.5)

## 2014-01-31 NOTE — Assessment & Plan Note (Signed)
pt stopped in 7/15 due to hip pain and sweats - resolved Labs

## 2014-01-31 NOTE — Progress Notes (Signed)
Pre visit review using our clinic review tool, if applicable. No additional management support is needed unless otherwise documented below in the visit note. 

## 2014-01-31 NOTE — Assessment & Plan Note (Signed)
S/p surgery 

## 2014-01-31 NOTE — Progress Notes (Signed)
   Subjective:    HPI C/o sweats and leg pains - both went away after he stopped Metformin 2 weeks ago...   F/u low testosterone - doing much better overall, fatigue - better, labs.  The patient presents for a follow-up of  chronic GERD, type 2 diabetes, OA/LBP - he stopped Vicodin (2/15)  F/u on low testosterone - feeling better on RX    F/u L hip pain - s/p THR by Dr Wynelle Link in 2014- better  BP Readings from Last 3 Encounters:  01/31/14 130/70  10/23/13 158/81  10/23/13 158/81   Wt Readings from Last 3 Encounters:  01/31/14 256 lb (116.121 kg)  10/16/13 255 lb (115.667 kg)  10/15/13 257 lb 9.6 oz (116.847 kg)      Review of Systems  Constitutional: Negative for diaphoresis, activity change and fatigue.  HENT: Negative for congestion.   Eyes: Negative for visual disturbance.  Respiratory: Negative for wheezing.   Gastrointestinal: Negative for blood in stool.  Genitourinary: Negative for decreased urine volume.  Musculoskeletal: Positive for arthralgias, back pain and gait problem. Negative for neck stiffness.       L hip severe pain and locking  Psychiatric/Behavioral: Negative for suicidal ideas. The patient is not nervous/anxious.    No CP, no SOB    Objective:   Physical Exam  Constitutional: He is oriented to person, place, and time. He appears well-developed. No distress.  Obese   HENT:  Mouth/Throat: Oropharynx is clear and moist.  Eyes: Conjunctivae are normal. Pupils are equal, round, and reactive to light.  Neck: Normal range of motion. No JVD present. No thyromegaly present.  Cardiovascular: Normal rate, regular rhythm, normal heart sounds and intact distal pulses.  Exam reveals no gallop and no friction rub.   No murmur heard. Pulmonary/Chest: Effort normal and breath sounds normal. No respiratory distress. He has no wheezes. He has no rales. He exhibits no tenderness.  Abdominal: Soft. Bowel sounds are normal. He exhibits no distension and no mass.  There is no tenderness. There is no rebound and no guarding.  Musculoskeletal: Normal range of motion. He exhibits tenderness (L hip w/ROM). He exhibits no edema.  Lymphadenopathy:    He has no cervical adenopathy.  Neurological: He is alert and oriented to person, place, and time. He has normal reflexes. No cranial nerve deficit. He exhibits normal muscle tone. Coordination normal.  Skin: Skin is warm and dry. No rash noted.  Psychiatric: He has a normal mood and affect. His behavior is normal. Judgment and thought content normal.    BP 130/70  Pulse 76  Temp(Src) 98.4 F (36.9 C) (Oral)  Resp 16  Wt 256 lb (116.121 kg)   Lab Results  Component Value Date   WBC 9.0 10/16/2013   HGB 16.5 10/16/2013   HCT 50.1 10/16/2013   PLT 201 10/16/2013   GLUCOSE 116* 10/16/2013   CHOL 142 12/25/2012   TRIG 109.0 12/25/2012   HDL 27.10* 12/25/2012   LDLCALC 93 12/25/2012   ALT 22 08/02/2013   AST 20 08/02/2013   NA 140 10/16/2013   K 4.3 10/16/2013   CL 101 10/16/2013   CREATININE 1.21 10/16/2013   BUN 12 10/16/2013   CO2 28 10/16/2013   TSH 0.84 08/02/2013   PSA 0.81 10/21/2011   INR 0.93 04/14/2012   HGBA1C 6.0 08/02/2013       Assessment & Plan:

## 2014-01-31 NOTE — Assessment & Plan Note (Signed)
Doing fair 

## 2014-03-29 ENCOUNTER — Ambulatory Visit (INDEPENDENT_AMBULATORY_CARE_PROVIDER_SITE_OTHER): Payer: 59

## 2014-03-29 DIAGNOSIS — Z23 Encounter for immunization: Secondary | ICD-10-CM

## 2014-04-17 ENCOUNTER — Ambulatory Visit (INDEPENDENT_AMBULATORY_CARE_PROVIDER_SITE_OTHER): Payer: 59 | Admitting: Podiatry

## 2014-04-17 ENCOUNTER — Encounter: Payer: Self-pay | Admitting: Podiatry

## 2014-04-17 ENCOUNTER — Ambulatory Visit (INDEPENDENT_AMBULATORY_CARE_PROVIDER_SITE_OTHER): Payer: 59

## 2014-04-17 VITALS — BP 125/63 | HR 58 | Resp 16 | Ht 70.0 in | Wt 254.0 lb

## 2014-04-17 DIAGNOSIS — M7661 Achilles tendinitis, right leg: Secondary | ICD-10-CM

## 2014-04-17 DIAGNOSIS — M10071 Idiopathic gout, right ankle and foot: Secondary | ICD-10-CM

## 2014-04-17 MED ORDER — TRIAMCINOLONE ACETONIDE 10 MG/ML IJ SUSP
10.0000 mg | Freq: Once | INTRAMUSCULAR | Status: AC
  Administered 2014-04-17: 10 mg

## 2014-04-17 MED ORDER — OXAPROZIN 600 MG PO TABS: 600.0000 mg | ORAL_TABLET | Freq: Every day | ORAL | Status: DC

## 2014-04-17 NOTE — Patient Instructions (Signed)

## 2014-04-17 NOTE — Progress Notes (Signed)
   Subjective:    Patient ID: Patrick Baldwin, male    DOB: October 17, 1950, 63 y.o.   MRN: [id number]  HPI Comments: "I have pain in my heel"  Patient c/o severe aching posterior heel right for 3 days. Pain AM. Slightly swollen. Says its a little better today. No home treatment.  Foot Pain      Review of Systems  Musculoskeletal: Positive for gait problem.  All other systems reviewed and are negative.      Objective:   Physical Exam        Assessment & Plan:

## 2014-04-18 NOTE — Progress Notes (Signed)
Subjective:     Patient ID: Patrick Baldwin, male   DOB: [birth year], 63 y.o.   MRN: [id number]  Foot Pain   patient points to the posterior aspect of the right heel stating it's been extremely tender for the last few days and he does have a history of gout. States he is not taking any medicine currently for the gout and he is having trouble ambulating or wearing shoe gear   Review of Systems  All other systems reviewed and are negative.      Objective:   Physical Exam  Nursing note and vitals reviewed. Constitutional: He is oriented to person, place, and time.  Cardiovascular: Intact distal pulses.   Musculoskeletal: Normal range of motion.  Neurological: He is oriented to person, place, and time.  Skin: Skin is warm.   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 Patient has normal range of motion and muscle strength and mild equinus and I noted there to be severe discomfort in the posterior heel at the insertion of the Achilles tendon on the lateral side. Patient is noted to have good capillary refill time and is noted to be well oriented 3     Assessment:     Either acute Achilles tendinitis posterior right heel or possibly acute gout attack    Plan:     H&P and conditions reviewed. Today I did do a very careful injection of the lateral side 3 mg dexamethasone Kenalog 5 mg Xylocaine after first reviewing with him the risk of rupture associated with injection. He wanted procedure which was performed I advised her reduced activity and we are starting on Daypro 600 mg twice a day and ice therapy. Reappoint to recheck one week earlier if symptoms do not reduce

## 2014-04-24 ENCOUNTER — Encounter: Payer: Self-pay | Admitting: Podiatry

## 2014-04-24 ENCOUNTER — Ambulatory Visit (INDEPENDENT_AMBULATORY_CARE_PROVIDER_SITE_OTHER): Payer: 59 | Admitting: Podiatry

## 2014-04-24 DIAGNOSIS — M10071 Idiopathic gout, right ankle and foot: Secondary | ICD-10-CM

## 2014-04-24 DIAGNOSIS — M7661 Achilles tendinitis, right leg: Secondary | ICD-10-CM

## 2014-04-24 DIAGNOSIS — M7750 Other enthesopathy of unspecified foot: Secondary | ICD-10-CM

## 2014-04-24 NOTE — Progress Notes (Signed)
Subjective:     Patient ID: Patrick Baldwin, male   DOB: 17-Jun-1951, 63 y.o.   MRN: [id number]  HPIpatient presents stating my Achilles tendon on my right is feeling quite a bit better with mild discomfort but significant improvement   Review of Systems     Objective:   Physical Exam Neurovascular status intact with no change in health history and noted to have significant diminishment of discomfort in the posterior aspect of the right heel lateral side    Assessment:     Improved Achilles tendinitis right    Plan:     Advised on physical therapy shoe gear modifications and dispensed heel lifts in order to give elevation to the heels of both feet. Reappoint to recheck if needed

## 2014-05-21 ENCOUNTER — Other Ambulatory Visit: Payer: Self-pay | Admitting: Internal Medicine

## 2014-05-24 ENCOUNTER — Telehealth: Payer: Self-pay | Admitting: *Deleted

## 2014-05-24 MED ORDER — TESTOSTERONE 10 MG/ACT (2%) TD GEL: 30.0000 mg | Freq: Every day | TRANSDERMAL | Status: DC

## 2014-05-24 NOTE — Telephone Encounter (Signed)
Done

## 2014-05-24 NOTE — Telephone Encounter (Signed)
Rf req for Testosterone 10 mg apply 3 pumps onto skin once daily. Ok to Rf?

## 2014-05-24 NOTE — Telephone Encounter (Signed)
OK to fill this prescription with additional refills x5 Thank you!  

## 2014-08-05 ENCOUNTER — Ambulatory Visit: Payer: 59 | Admitting: Internal Medicine

## 2014-08-12 ENCOUNTER — Encounter: Payer: Self-pay | Admitting: Internal Medicine

## 2014-08-12 ENCOUNTER — Ambulatory Visit (INDEPENDENT_AMBULATORY_CARE_PROVIDER_SITE_OTHER): Payer: 59 | Admitting: Internal Medicine

## 2014-08-12 ENCOUNTER — Other Ambulatory Visit (INDEPENDENT_AMBULATORY_CARE_PROVIDER_SITE_OTHER): Payer: 59

## 2014-08-12 VITALS — BP 126/70 | HR 61 | Temp 99.0°F | Wt 263.2 lb

## 2014-08-12 DIAGNOSIS — E119 Type 2 diabetes mellitus without complications: Secondary | ICD-10-CM

## 2014-08-12 DIAGNOSIS — F32A Depression, unspecified: Secondary | ICD-10-CM

## 2014-08-12 DIAGNOSIS — M160 Bilateral primary osteoarthritis of hip: Secondary | ICD-10-CM

## 2014-08-12 DIAGNOSIS — M545 Low back pain, unspecified: Secondary | ICD-10-CM

## 2014-08-12 DIAGNOSIS — Z23 Encounter for immunization: Secondary | ICD-10-CM

## 2014-08-12 DIAGNOSIS — F329 Major depressive disorder, single episode, unspecified: Secondary | ICD-10-CM

## 2014-08-12 LAB — HEMOGLOBIN A1C: Hgb A1c MFr Bld: 6.6 % — ABNORMAL HIGH (ref 4.6–6.5)

## 2014-08-12 MED ORDER — ESOMEPRAZOLE MAGNESIUM 40 MG PO CPDR: 40.0000 mg | DELAYED_RELEASE_CAPSULE | Freq: Every day | ORAL | Status: DC

## 2014-08-12 NOTE — Progress Notes (Signed)
   Subjective:    HPI   C/o sweats and leg pains - both went away after he stopped Metformin 2 weeks ago...   F/u low testosterone - doing much better overall, fatigue - better, labs.  The patient presents for a follow-up of  chronic GERD, type 2 diabetes, OA/LBP - he stopped Vicodin (2/15)  F/u on low testosterone - feeling better on RX    F/u L hip pain - s/p THR by Dr Wynelle Link in 2014- better  BP Readings from Last 3 Encounters:  08/12/14 126/70  04/17/14 125/63  01/31/14 130/70   Wt Readings from Last 3 Encounters:  08/12/14 263 lb 4 oz (119.409 kg)  04/17/14 254 lb (115.214 kg)  01/31/14 256 lb (116.121 kg)      Review of Systems  Constitutional: Negative for diaphoresis, activity change and fatigue.  HENT: Negative for congestion.   Eyes: Negative for visual disturbance.  Respiratory: Negative for wheezing.   Gastrointestinal: Negative for blood in stool.  Genitourinary: Negative for decreased urine volume.  Musculoskeletal: Positive for back pain, arthralgias and gait problem. Negative for neck stiffness.       L hip severe pain and locking  Psychiatric/Behavioral: Negative for suicidal ideas. The patient is not nervous/anxious.    No CP, no SOB    Objective:   Physical Exam  Constitutional: He is oriented to person, place, and time. He appears well-developed. No distress.  Obese   HENT:  Mouth/Throat: Oropharynx is clear and moist.  Eyes: Conjunctivae are normal. Pupils are equal, round, and reactive to light.  Neck: Normal range of motion. No JVD present. No thyromegaly present.  Cardiovascular: Normal rate, regular rhythm, normal heart sounds and intact distal pulses.  Exam reveals no gallop and no friction rub.   No murmur heard. Pulmonary/Chest: Effort normal and breath sounds normal. No respiratory distress. He has no wheezes. He has no rales. He exhibits no tenderness.  Abdominal: Soft. Bowel sounds are normal. He exhibits no distension and no mass.  There is no tenderness. There is no rebound and no guarding.  Musculoskeletal: Normal range of motion. He exhibits tenderness (L hip w/ROM). He exhibits no edema.  Lymphadenopathy:    He has no cervical adenopathy.  Neurological: He is alert and oriented to person, place, and time. He has normal reflexes. No cranial nerve deficit. He exhibits normal muscle tone. Coordination normal.  Skin: Skin is warm and dry. No rash noted.  Psychiatric: He has a normal mood and affect. His behavior is normal. Judgment and thought content normal.    BP 126/70 mmHg  Pulse 61  Temp(Src) 99 F (37.2 C) (Oral)  Wt 263 lb 4 oz (119.409 kg)  SpO2 96%   Lab Results  Component Value Date   WBC 9.0 10/16/2013   HGB 16.5 10/16/2013   HCT 50.1 10/16/2013   PLT 201 10/16/2013   GLUCOSE 99 01/31/2014   CHOL 142 12/25/2012   TRIG 109.0 12/25/2012   HDL 27.10* 12/25/2012   LDLCALC 93 12/25/2012   ALT 22 08/02/2013   AST 20 08/02/2013   NA 138 01/31/2014   K 5.3* 01/31/2014   CL 101 01/31/2014   CREATININE 1.4 01/31/2014   BUN 13 01/31/2014   CO2 31 01/31/2014   TSH 0.84 08/02/2013   PSA 0.81 10/21/2011   INR 0.93 04/14/2012   HGBA1C 6.0 01/31/2014       Assessment & Plan:

## 2014-08-12 NOTE — Assessment & Plan Note (Signed)
Labs

## 2014-08-12 NOTE — Assessment & Plan Note (Signed)
Better  

## 2014-08-12 NOTE — Assessment & Plan Note (Signed)
Continue with current prn therapy as reflected on the Med list.

## 2014-08-12 NOTE — Progress Notes (Signed)
Pre visit review using our clinic review tool, if applicable. No additional management support is needed unless otherwise documented below in the visit note. 

## 2014-08-13 LAB — BASIC METABOLIC PANEL
BUN: 15 mg/dL (ref 6–23)
CO2: 28 meq/L (ref 19–32)
Calcium: 9.9 mg/dL (ref 8.4–10.5)
Chloride: 102 mEq/L (ref 96–112)
Creatinine, Ser: 1.3 mg/dL (ref 0.40–1.50)
GFR: 59.13 mL/min — AB (ref >60.00)
GLUCOSE: 116 mg/dL — AB (ref 70–99)
POTASSIUM: 4.3 meq/L (ref 3.5–5.1)
Sodium: 139 mEq/L (ref 135–145)

## 2014-08-20 NOTE — Assessment & Plan Note (Signed)
Better  

## 2014-08-23 ENCOUNTER — Telehealth: Payer: Self-pay | Admitting: *Deleted

## 2014-08-23 DIAGNOSIS — M7989 Other specified soft tissue disorders: Secondary | ICD-10-CM

## 2014-08-23 DIAGNOSIS — Z8739 Personal history of other diseases of the musculoskeletal system and connective tissue: Secondary | ICD-10-CM

## 2014-08-23 DIAGNOSIS — M79676 Pain in unspecified toe(s): Secondary | ICD-10-CM

## 2014-08-23 MED ORDER — COLCHICINE 0.6 MG PO TABS: ORAL_TABLET | ORAL | Status: DC

## 2014-08-23 NOTE — Telephone Encounter (Signed)
Rf sent

## 2014-08-23 NOTE — Telephone Encounter (Signed)
OK to fill this prescription with additional refills Thank you!

## 2014-08-23 NOTE — Telephone Encounter (Signed)
Rf req for Colcrys 0.6 mg ((please advise on sig)) #90. Last filled 12/29/12. Med is not active in chart. Ok to Rf?

## 2014-11-26 ENCOUNTER — Other Ambulatory Visit: Payer: Self-pay | Admitting: *Deleted

## 2014-11-26 NOTE — Telephone Encounter (Signed)
OK to fill this prescription with additional refills x5 Thank you!  

## 2014-11-26 NOTE — Telephone Encounter (Signed)
Rf req for Testosterone 10 mg gel pump apply 3 pumps onto skin qd. Last filled 10/23/14. Ok to Rf?

## 2014-11-27 MED ORDER — TESTOSTERONE 10 MG/ACT (2%) TD GEL: 30.0000 mg | Freq: Every day | TRANSDERMAL | Status: DC

## 2014-11-27 NOTE — Telephone Encounter (Signed)
Printed rx signed/faxed to pharmacy.

## 2014-12-16 ENCOUNTER — Telehealth: Payer: Self-pay | Admitting: Internal Medicine

## 2014-12-16 NOTE — Telephone Encounter (Signed)
Ok Thx 

## 2014-12-16 NOTE — Telephone Encounter (Signed)
Patient ask if he could get a prescription for a mask for his C pap machine, to send to [facility]

## 2014-12-17 NOTE — Telephone Encounter (Signed)
Written order faxed to [facility]. Left detailed mess informing pt.

## 2014-12-18 ENCOUNTER — Ambulatory Visit (INDEPENDENT_AMBULATORY_CARE_PROVIDER_SITE_OTHER): Payer: Commercial Managed Care - HMO | Admitting: Podiatry

## 2014-12-18 ENCOUNTER — Ambulatory Visit (INDEPENDENT_AMBULATORY_CARE_PROVIDER_SITE_OTHER): Payer: Commercial Managed Care - HMO

## 2014-12-18 DIAGNOSIS — M722 Plantar fascial fibromatosis: Secondary | ICD-10-CM | POA: Diagnosis not present

## 2014-12-18 DIAGNOSIS — M10071 Idiopathic gout, right ankle and foot: Secondary | ICD-10-CM

## 2014-12-18 DIAGNOSIS — M79671 Pain in right foot: Secondary | ICD-10-CM

## 2014-12-18 MED ORDER — ALLOPURINOL 300 MG PO TABS: 300.0000 mg | ORAL_TABLET | Freq: Every day | ORAL | Status: DC

## 2014-12-18 MED ORDER — TRIAMCINOLONE ACETONIDE 10 MG/ML IJ SUSP
10.0000 mg | Freq: Once | INTRAMUSCULAR | Status: AC
  Administered 2014-12-18: 10 mg

## 2014-12-18 NOTE — Progress Notes (Signed)
   Subjective:    Patient ID: Patrick Baldwin, male    DOB: 1951/03/19, 64 y.o.   MRN: [id number]  HPI  Pt presents with a painful knot on the bottom of his right foot, in the arch area. States that it has been there for approx 1 month. He also c/o bilateral calluses that are painful  Review of Systems     Objective:   Physical Exam        Assessment & Plan:

## 2014-12-18 NOTE — Patient Instructions (Signed)

## 2014-12-19 NOTE — Progress Notes (Signed)
Subjective:     Patient ID: Patrick Baldwin, male   DOB: October 19, 1950, 64 y.o.   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 Reappoint to recheck in the next 4 weeks

## 2015-01-27 ENCOUNTER — Ambulatory Visit (INDEPENDENT_AMBULATORY_CARE_PROVIDER_SITE_OTHER): Payer: Commercial Managed Care - HMO | Admitting: Podiatry

## 2015-01-27 ENCOUNTER — Encounter: Payer: Self-pay | Admitting: Podiatry

## 2015-01-27 VITALS — BP 113/74 | HR 65 | Resp 16

## 2015-01-27 DIAGNOSIS — M779 Enthesopathy, unspecified: Secondary | ICD-10-CM | POA: Diagnosis not present

## 2015-01-27 DIAGNOSIS — M722 Plantar fascial fibromatosis: Secondary | ICD-10-CM

## 2015-01-27 MED ORDER — TRIAMCINOLONE ACETONIDE 10 MG/ML IJ SUSP
10.0000 mg | Freq: Once | INTRAMUSCULAR | Status: AC
Start: 2015-01-27 — End: 2015-01-27
  Administered 2015-01-27: 10 mg

## 2015-01-28 NOTE — Progress Notes (Signed)
Subjective:     Patient ID: Patrick Baldwin, male   DOB: Jun 19, 1951, 64 y.o.   MRN: [id number]  HPI patient points to the bottom of right foot stating that it still been bothering him and he was concerned and is wondering what else can be done. States that it got better for a little while but then reoccurred    Review of Systems     Objective:   Physical Exam Neurovascular status intact with continued discomfort plantar aspect right foot around the mid arch area with fluid buildup noted and pain with palpation. There is the appearance of a nodule in this area but it's difficult to measure difficult to feel as it is somewhat diffuse in its nature.    Assessment:     Possibility for plantar fascial nodule right arch versus inflammatory fasciitis condition    Plan:     Discussed possibility for MRI if this does not get better but we'll get a try immobilization and heat and ice treatment first. Air fracture walker was administered and applied and I did do a careful injection 3 mg Kenalog 5 mg Xylocaine to reduce inflammation at this time

## 2015-02-10 ENCOUNTER — Ambulatory Visit (INDEPENDENT_AMBULATORY_CARE_PROVIDER_SITE_OTHER): Payer: Commercial Managed Care - HMO | Admitting: Internal Medicine

## 2015-02-10 ENCOUNTER — Other Ambulatory Visit (INDEPENDENT_AMBULATORY_CARE_PROVIDER_SITE_OTHER): Payer: Commercial Managed Care - HMO

## 2015-02-10 ENCOUNTER — Encounter: Payer: Self-pay | Admitting: Internal Medicine

## 2015-02-10 VITALS — BP 130/76 | HR 58 | Wt 260.0 lb

## 2015-02-10 DIAGNOSIS — M79676 Pain in unspecified toe(s): Secondary | ICD-10-CM

## 2015-02-10 DIAGNOSIS — M7989 Other specified soft tissue disorders: Secondary | ICD-10-CM | POA: Diagnosis not present

## 2015-02-10 DIAGNOSIS — E291 Testicular hypofunction: Secondary | ICD-10-CM

## 2015-02-10 DIAGNOSIS — Z8739 Personal history of other diseases of the musculoskeletal system and connective tissue: Secondary | ICD-10-CM

## 2015-02-10 DIAGNOSIS — D485 Neoplasm of uncertain behavior of skin: Secondary | ICD-10-CM

## 2015-02-10 DIAGNOSIS — K2100 Gastro-esophageal reflux disease with esophagitis, without bleeding: Secondary | ICD-10-CM

## 2015-02-10 DIAGNOSIS — Z8639 Personal history of other endocrine, nutritional and metabolic disease: Secondary | ICD-10-CM | POA: Diagnosis not present

## 2015-02-10 DIAGNOSIS — M1000 Idiopathic gout, unspecified site: Secondary | ICD-10-CM

## 2015-02-10 DIAGNOSIS — M5440 Lumbago with sciatica, unspecified side: Secondary | ICD-10-CM

## 2015-02-10 LAB — HEPATIC FUNCTION PANEL
ALT: 23 U/L (ref 0–53)
AST: 21 U/L (ref 0–37)
Albumin: 4.4 g/dL (ref 3.5–5.2)
Alkaline Phosphatase: 57 U/L (ref 39–117)
BILIRUBIN DIRECT: 0.1 mg/dL (ref 0.0–0.3)
TOTAL PROTEIN: 7.4 g/dL (ref 6.0–8.3)
Total Bilirubin: 0.5 mg/dL (ref 0.2–1.2)

## 2015-02-10 LAB — BASIC METABOLIC PANEL
BUN: 14 mg/dL (ref 6–23)
CALCIUM: 9.7 mg/dL (ref 8.4–10.5)
CO2: 28 meq/L (ref 19–32)
CREATININE: 1.44 mg/dL (ref 0.40–1.50)
Chloride: 102 mEq/L (ref 96–112)
GFR: 52.46 mL/min — ABNORMAL LOW (ref >60.00)
Glucose, Bld: 107 mg/dL — ABNORMAL HIGH (ref 70–99)
Potassium: 4.5 mEq/L (ref 3.5–5.1)
Sodium: 139 mEq/L (ref 135–145)

## 2015-02-10 LAB — CBC WITH DIFFERENTIAL/PLATELET
BASOS ABS: 0 10*3/uL (ref 0.0–0.1)
Basophils Relative: 0.3 % (ref 0.0–3.0)
Eosinophils Absolute: 0.2 10*3/uL (ref 0.0–0.7)
Eosinophils Relative: 1.7 % (ref 0.0–5.0)
HEMATOCRIT: 50.4 % (ref 39.0–52.0)
HEMOGLOBIN: 17 g/dL (ref 13.0–17.0)
LYMPHS PCT: 16.8 % (ref 12.0–46.0)
Lymphs Abs: 1.6 10*3/uL (ref 0.7–4.0)
MCHC: 33.8 g/dL (ref 30.0–36.0)
MCV: 89.6 fl (ref 78.0–100.0)
MONOS PCT: 7.9 % (ref 3.0–12.0)
Monocytes Absolute: 0.8 10*3/uL (ref 0.1–1.0)
NEUTROS ABS: 7.2 10*3/uL (ref 1.4–7.7)
Neutrophils Relative %: 73.3 % (ref 43.0–77.0)
PLATELETS: 219 10*3/uL (ref 150.0–400.0)
RBC: 5.63 Mil/uL (ref 4.22–5.81)
RDW: 14.1 % (ref 11.5–15.5)
WBC: 9.8 10*3/uL (ref 4.0–10.5)

## 2015-02-10 LAB — URIC ACID: Uric Acid, Serum: 6 mg/dL (ref 4.0–7.8)

## 2015-02-10 LAB — PSA: PSA: 1.08 ng/mL (ref 0.10–4.00)

## 2015-02-10 LAB — HEMOGLOBIN A1C: Hgb A1c MFr Bld: 6.2 % (ref 4.6–6.5)

## 2015-02-10 MED ORDER — COLCHICINE 0.6 MG PO TABS: ORAL_TABLET | ORAL | Status: DC

## 2015-02-10 MED ORDER — ESOMEPRAZOLE MAGNESIUM 40 MG PO CPDR: 40.0000 mg | DELAYED_RELEASE_CAPSULE | Freq: Every day | ORAL | Status: DC

## 2015-02-10 MED ORDER — TESTOSTERONE 10 MG/ACT (2%) TD GEL: 30.0000 mg | Freq: Every day | TRANSDERMAL | Status: DC

## 2015-02-10 MED ORDER — SILDENAFIL CITRATE 100 MG PO TABS: ORAL_TABLET | ORAL | Status: AC

## 2015-02-10 NOTE — Progress Notes (Signed)
Subjective:  Patient ID: Patrick Baldwin, male    DOB: 09/13/1950  Age: 64 y.o. MRN: [id number]  CC: No chief complaint on file.   HPI LUVERNE ZERKLE presents for sweats and leg pains f/u - both went away after he stopped Metformin   F/u low testosterone - doing much better overall The patient presents for a follow-up of chronic GERD, type 2 diabetes, OA/LBP - he stopped Vicodin (2/15)    Outpatient Prescriptions Prior to Visit  Medication Sig Dispense Refill  . allopurinol (ZYLOPRIM) 300 MG tablet Take 1 tablet (300 mg total) by mouth daily. 30 tablet 3  . aspirin 81 MG tablet Take 81 mg by mouth daily.    Marland Kitchen b complex vitamins tablet Take 1 tablet by mouth daily.    . cholecalciferol (VITAMIN D) 1000 UNITS tablet Take 1,000 Units by mouth daily.    Marland Kitchen glucosamine-chondroitin 500-400 MG tablet Take 1 tablet by mouth 3 (three) times daily.    . Magnesium 400 MG CAPS Take 1 each by mouth daily.    Marland Kitchen oxaprozin (DAYPRO) 600 MG tablet Take 1 tablet (600 mg total) by mouth daily. 50 tablet 1  . TURMERIC PO Take 1 tablet by mouth daily.     . colchicine 0.6 MG tablet Take two tabs (1.2mg  total) initially.  Take 0.6mg  1 hour later if needed.  Do not exceed 1.8mg  (3 tabs) in one day.  After that may take 1 tab 2-3 times per day for the duration of symptoms. 30 tablet 3  . esomeprazole (NEXIUM) 40 MG capsule Take 1 capsule (40 mg total) by mouth daily before breakfast. 90 capsule 3  . Testosterone 10 MG/ACT (2%) GEL Place 30 mg onto the skin daily. 60 g 5  . VIAGRA 100 MG tablet take 1 tablet by mouth once daily if needed 12 tablet 5   No facility-administered medications prior to visit.    ROS Review of Systems  Constitutional: Negative for appetite change, fatigue and unexpected weight change.  HENT: Negative for congestion, nosebleeds, sneezing, sore throat and trouble swallowing.   Eyes: Negative for itching and visual disturbance.  Respiratory: Negative for cough.   Cardiovascular:  Negative for chest pain, palpitations and leg swelling.  Gastrointestinal: Negative for nausea, diarrhea, blood in stool and abdominal distention.  Genitourinary: Negative for frequency and hematuria.  Musculoskeletal: Negative for back pain, joint swelling, gait problem and neck pain.  Skin: Positive for color change. Negative for pallor and rash.  Neurological: Negative for dizziness, tremors, speech difficulty and weakness.  Psychiatric/Behavioral: Negative for sleep disturbance, dysphoric mood and agitation. The patient is not nervous/anxious.     Objective:  BP 130/76 mmHg  Pulse 58  Wt 260 lb (117.935 kg)  SpO2 97%  BP Readings from Last 3 Encounters:  02/10/15 130/76  01/27/15 113/74  08/12/14 126/70    Wt Readings from Last 3 Encounters:  02/10/15 260 lb (117.935 kg)  08/12/14 263 lb 4 oz (119.409 kg)  04/17/14 254 lb (115.214 kg)    Physical Exam  Constitutional: He is oriented to person, place, and time. He appears well-developed. No distress.  NAD  HENT:  Mouth/Throat: Oropharynx is clear and moist.  Eyes: Conjunctivae are normal. Pupils are equal, round, and reactive to light.  Neck: Normal range of motion. No JVD present. No thyromegaly present.  Cardiovascular: Normal rate, regular rhythm, normal heart sounds and intact distal pulses.  Exam reveals no gallop and no friction rub.   No murmur  heard. Pulmonary/Chest: Effort normal and breath sounds normal. No respiratory distress. He has no wheezes. He has no rales. He exhibits no tenderness.  Abdominal: Soft. Bowel sounds are normal. He exhibits no distension and no mass. There is no tenderness. There is no rebound and no guarding.  Musculoskeletal: Normal range of motion. He exhibits no edema or tenderness.  Lymphadenopathy:    He has no cervical adenopathy.  Neurological: He is alert and oriented to person, place, and time. He has normal reflexes. No cranial nerve deficit. He exhibits normal muscle tone. He  displays a negative Romberg sign. Coordination and gait normal.  Skin: Skin is warm and dry. No rash noted.  Psychiatric: He has a normal mood and affect. His behavior is normal. Judgment and thought content normal.   B varicouse veins  L cheek pigm mole   Lab Results  Component Value Date   WBC 9.0 10/16/2013   HGB 16.5 10/16/2013   HCT 50.1 10/16/2013   PLT 201 10/16/2013   GLUCOSE 116* 08/12/2014   CHOL 142 12/25/2012   TRIG 109.0 12/25/2012   HDL 27.10* 12/25/2012   LDLCALC 93 12/25/2012   ALT 22 08/02/2013   AST 20 08/02/2013   NA 139 08/12/2014   K 4.3 08/12/2014   CL 102 08/12/2014   CREATININE 1.30 08/12/2014   BUN 15 08/12/2014   CO2 28 08/12/2014   TSH 0.84 08/02/2013   PSA 0.81 10/21/2011   INR 0.93 04/14/2012   HGBA1C 6.6* 08/12/2014    No results found.  Assessment & Plan:   Diagnoses and all orders for this visit:  Pain of toe, unspecified laterality -     Basic metabolic panel; Future -     Hemoglobin A1c; Future -     PSA; Future -     Hepatic function panel; Future -     CBC with Differential/Platelet; Future -     Uric acid; Future  Toe swelling -     Basic metabolic panel; Future -     Hemoglobin A1c; Future -     PSA; Future -     Hepatic function panel; Future -     CBC with Differential/Platelet; Future -     Uric acid; Future  History of gout -     Basic metabolic panel; Future -     Hemoglobin A1c; Future -     PSA; Future -     Hepatic function panel; Future -     CBC with Differential/Platelet; Future -     Uric acid; Future  Hypogonadism male -     Basic metabolic panel; Future -     Hemoglobin A1c; Future -     PSA; Future -     Hepatic function panel; Future -     CBC with Differential/Platelet; Future -     Uric acid; Future  Acute idiopathic gout, unspecified site -     Basic metabolic panel; Future -     Hemoglobin A1c; Future -     PSA; Future -     Hepatic function panel; Future -     CBC with  Differential/Platelet; Future -     Uric acid; Future  Midline low back pain with sciatica, sciatica laterality unspecified -     Basic metabolic panel; Future -     Hemoglobin A1c; Future -     PSA; Future -     Hepatic function panel; Future -     CBC with Differential/Platelet;  Future -     Uric acid; Future  Gastroesophageal reflux disease with esophagitis -     Basic metabolic panel; Future -     Hemoglobin A1c; Future -     PSA; Future -     Hepatic function panel; Future -     CBC with Differential/Platelet; Future -     Uric acid; Future  Neoplasm of uncertain behavior of skin  Other orders -     colchicine 0.6 MG tablet; Take two tabs (1.2mg  total) initially.  Take 0.6mg  1 hour later if needed.  Do not exceed 1.8mg  (3 tabs) in one day.  After that may take 1 tab 2-3 times per day for the duration of symptoms. -     esomeprazole (NEXIUM) 40 MG capsule; Take 1 capsule (40 mg total) by mouth daily before breakfast. -     sildenafil (VIAGRA) 100 MG tablet; take 1 tablet by mouth once daily if needed -     Cancel: oxaprozin (DAYPRO) 600 MG tablet; Take 1 tablet (600 mg total) by mouth daily. -     Testosterone 10 MG/ACT (2%) GEL; Place 30 mg onto the skin daily.   I have changed Mr. Vernet VIAGRA to sildenafil. I am also having him maintain his glucosamine-chondroitin, TURMERIC PO, aspirin, b complex vitamins, cholecalciferol, Magnesium, oxaprozin, allopurinol, colchicine, esomeprazole, and Testosterone.  Meds ordered this encounter  Medications  . colchicine 0.6 MG tablet    Sig: Take two tabs (1.2mg  total) initially.  Take 0.6mg  1 hour later if needed.  Do not exceed 1.8mg  (3 tabs) in one day.  After that may take 1 tab 2-3 times per day for the duration of symptoms.    Dispense:  30 tablet    Refill:  3  . esomeprazole (NEXIUM) 40 MG capsule    Sig: Take 1 capsule (40 mg total) by mouth daily before breakfast.    Dispense:  90 capsule    Refill:  3  . sildenafil  (VIAGRA) 100 MG tablet    Sig: take 1 tablet by mouth once daily if needed    Dispense:  12 tablet    Refill:  5  . Testosterone 10 MG/ACT (2%) GEL    Sig: Place 30 mg onto the skin daily.    Dispense:  60 g    Refill:  5     Follow-up: Return in about 4 months (around 06/12/2015) for a follow-up visit.  Walker Kehr, MD

## 2015-02-10 NOTE — Assessment & Plan Note (Signed)
Chronic Off Norco lately

## 2015-02-10 NOTE — Assessment & Plan Note (Signed)
Recurrent - feet; Dr Paulla Dolly

## 2015-02-10 NOTE — Assessment & Plan Note (Signed)
Skin bx 

## 2015-02-10 NOTE — Assessment & Plan Note (Signed)
On Testosterone  Potential benefits of a long term testosterone  use as well as potential risks  and complications were explained to the patient and were aknowledged.

## 2015-02-10 NOTE — Assessment & Plan Note (Signed)
Chronic On Nexium

## 2015-02-10 NOTE — Progress Notes (Signed)
Pre visit review using our clinic review tool, if applicable. No additional management support is needed unless otherwise documented below in the visit note. 

## 2015-02-17 ENCOUNTER — Encounter: Payer: Self-pay | Admitting: Podiatry

## 2015-02-17 ENCOUNTER — Ambulatory Visit (INDEPENDENT_AMBULATORY_CARE_PROVIDER_SITE_OTHER): Payer: Commercial Managed Care - HMO | Admitting: Podiatry

## 2015-02-17 VITALS — BP 120/64 | HR 61 | Resp 16

## 2015-02-17 DIAGNOSIS — M2011 Hallux valgus (acquired), right foot: Secondary | ICD-10-CM | POA: Diagnosis not present

## 2015-02-17 DIAGNOSIS — M722 Plantar fascial fibromatosis: Secondary | ICD-10-CM | POA: Diagnosis not present

## 2015-02-17 DIAGNOSIS — M21611 Bunion of right foot: Secondary | ICD-10-CM

## 2015-02-17 NOTE — Patient Instructions (Signed)
Pre-Operative Instructions  Congratulations, you have decided to take an important step to improving your quality of life.  You can be assured that the doctors of [facility] will be with you every step of the way.  1. Plan to be at the surgery center/hospital at least 1 (one) hour prior to your scheduled time unless otherwise directed by the surgical center/hospital staff.  You must have a responsible adult accompany you, remain during the surgery and drive you home.  Make sure you have directions to the surgical center/hospital and know how to get there on time. 2. For hospital based surgery you will need to obtain a history and physical form from your family physician within 1 month prior to the date of surgery- we will give you a form for you primary physician.  3. We make every effort to accommodate the date you request for surgery.  There are however, times where surgery dates or times have to be moved.  We will contact you as soon as possible if a change in schedule is required.   4. No Aspirin/Ibuprofen for one week before surgery.  If you are on aspirin, any non-steroidal anti-inflammatory medications (Mobic, Aleve, Ibuprofen) you should stop taking it 7 days prior to your surgery.  You make take Tylenol  For pain prior to surgery.  5. Medications- If you are taking daily heart and blood pressure medications, seizure, reflux, allergy, asthma, anxiety, pain or diabetes medications, make sure the surgery center/hospital is aware before the day of surgery so they may notify you which medications to take or avoid the day of surgery. 6. No food or drink after midnight the night before surgery unless directed otherwise by surgical center/hospital staff. 7. No alcoholic beverages 24 hours prior to surgery.  No smoking 24 hours prior to or 24 hours after surgery. 8. Wear loose pants or shorts- loose enough to fit over bandages, boots, and casts. 9. No slip on shoes, sneakers are best. 10. Bring  your boot with you to the surgery center/hospital.  Also bring crutches or a walker if your physician has prescribed it for you.  If you do not have this equipment, it will be provided for you after surgery. 11. If you have not been contracted by the surgery center/hospital by the day before your surgery, call to confirm the date and time of your surgery. 12. Leave-time from work may vary depending on the type of surgery you have.  Appropriate arrangements should be made prior to surgery with your employer. 13. Prescriptions will be provided immediately following surgery by your doctor.  Have these filled as soon as possible after surgery and take the medication as directed. 14. Remove nail polish on the operative foot. 15. Wash the night before surgery.  The night before surgery wash the foot and leg well with the antibacterial soap provided and water paying special attention to beneath the toenails and in between the toes.  Rinse thoroughly with water and dry well with a towel.  Perform this wash unless told not to do so by your physician.  Enclosed: 1 Ice pack (please put in freezer the night before surgery)   1 Hibiclens skin cleaner   Pre-op Instructions  If you have any questions regarding the instructions, do not hesitate to call our office.  [place]: [address] [place], [place] [address] [phone]  [place]: [address] [place], [place] [address] [phone]  [place]: [address]  [place], [place] [address] [phone]  Dr. Richard   Tuchman DPM, Dr. Norman Regal DPM Dr. Richard Sikora DPM, Dr. M. Todd Hyatt DPM, Dr. Kathryn Egerton DPM 

## 2015-02-18 ENCOUNTER — Telehealth: Payer: Self-pay | Admitting: *Deleted

## 2015-02-18 NOTE — Telephone Encounter (Signed)
"  I'm calling for Patrick Baldwin.  I'm trying to verify his date of surgery.  The paperwork says September 20 but he seems to think they said 22nd. We  need to know the date.  They did not give Patrick Baldwin a time."  I left her a message that Patrick Baldwin's surgery date is 03/11/2015.  Dr. Paulla Dolly only does surgeries on Tuesday.  The surgical center will call you a day or two prior to surgery.  They will let him know exactly what time to arrive.  We do not give out times because there may be a cancellation or a patient may have a medical condition and need to go first.  Please call if you have any further questions.

## 2015-02-18 NOTE — Progress Notes (Signed)
Subjective:     Patient ID: Patrick Baldwin, male   DOB: Feb 01, 1951, 64 y.o.   MRN: [id number]  HPI patient presents stating this nodule on the bottom my right foot has been killing me and also continuing to have structural problems around my big toe joint right with bunion deformity redness and keratotic lesion around my big toe. Patient states that he has tried to trim the lesion to pad the lesion and to wear wider shoes and soaks without relief of symptoms surrounding the structural bunion deformity and at the pain on the bottom of the foot continues to intensify and he feels maybe the nodules are getting bigger   Review of Systems     Objective:   Physical Exam Neurovascular status intact muscle strength adequate with nodules on the plantar aspect of the right mid arch area with several different nodules with the biggest one measuring approximately 2 cm x 2 cm with intense discomfort and a smaller distal nodule present. It appears to be within the plantar fascia but difficult to tell and has not responded to injection therapy heat ice therapy and immobilization. 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 We discussed treatment options and I discuss considerations for MRI physical therapy versus surgical excision of nodules and structural bunion correction. Patient states that he's tried home physical therapy and it hurts him a lot and he wants it removed and wants to know what it is plantar aspect foot and I have recommended the removal of possible cyst or  plantar fibroma plantar aspect right with pathology along with structural distal osteotomy. I allowed him to read consent form discussing all possible complications and alternative treatments as listed and the fact there is no long-term guarantees as to what this problem is and how well it we'll recover and the fact that it may recur. Patient wants surgery and is scheduled for outpatient surgery and understands he'll be nonweightbearing 2-3 weeks and he will wear his boot postoperatively for the first approximate 4 weeks with total recovery. Take 6 months to one year. Signs consent form is given all preoperative instructions and is scheduled for outpatient surgery. I went ahead today reviewed x-ray report indicating elevation of the intermetatarsal angle with deviation of the hallux against the second toe and approximate 1 to intermetatarsal angle of 16

## 2015-03-05 ENCOUNTER — Telehealth: Payer: Self-pay | Admitting: *Deleted

## 2015-03-05 ENCOUNTER — Other Ambulatory Visit: Payer: Self-pay | Admitting: Dermatology

## 2015-03-05 NOTE — Telephone Encounter (Signed)
I faxed authorization for surgery scheduled for 03/11/2015 for [facility] and Plantar Fibroma right to Caren Griffins at [facility].  Authorization number is [id number].

## 2015-03-11 ENCOUNTER — Encounter: Payer: Self-pay | Admitting: *Deleted

## 2015-03-11 DIAGNOSIS — M2011 Hallux valgus (acquired), right foot: Secondary | ICD-10-CM | POA: Diagnosis not present

## 2015-03-11 DIAGNOSIS — M722 Plantar fascial fibromatosis: Secondary | ICD-10-CM | POA: Diagnosis not present

## 2015-03-12 ENCOUNTER — Telehealth: Payer: Self-pay | Admitting: *Deleted

## 2015-03-12 NOTE — Progress Notes (Signed)
Surgery performed at [facility] for [facility] right foot and Plantar Fibroma right foot.  Prescription was written for Percocet 10/325, quantity 40.

## 2015-03-12 NOTE — Telephone Encounter (Signed)
"  I'm doing fine.  I've been keeping my foot elevated.  Someone has called me already today.  I have a question about the pain medication.  Is it stronger than Vicoden?"  Yes, it is stronger.  "The reason I was asking is because I thought it was.  I used to take Vicodin in the past.  My prescription says I can take one or two tablets at a time.  So when I got home from surgery I didn't want to feel any pain so I took 2 of the pills.  Man it really put me out."  Well take only one and eventually you will get to the point where you don't need to take it at all and just use Advil, Ibuprofen or Tylenol.  "Is it okay to take Aleve?"  Yes, is it's okay to take Aleve.

## 2015-03-13 ENCOUNTER — Ambulatory Visit: Payer: Commercial Managed Care - HMO | Admitting: Internal Medicine

## 2015-03-21 ENCOUNTER — Ambulatory Visit (INDEPENDENT_AMBULATORY_CARE_PROVIDER_SITE_OTHER): Payer: Commercial Managed Care - HMO

## 2015-03-21 ENCOUNTER — Ambulatory Visit (INDEPENDENT_AMBULATORY_CARE_PROVIDER_SITE_OTHER): Payer: Commercial Managed Care - HMO | Admitting: Podiatry

## 2015-03-21 VITALS — BP 136/74 | HR 68 | Temp 97.9°F | Resp 16

## 2015-03-21 DIAGNOSIS — M10071 Idiopathic gout, right ankle and foot: Secondary | ICD-10-CM

## 2015-03-21 DIAGNOSIS — Z9889 Other specified postprocedural states: Secondary | ICD-10-CM | POA: Diagnosis not present

## 2015-03-21 DIAGNOSIS — M2011 Hallux valgus (acquired), right foot: Secondary | ICD-10-CM | POA: Diagnosis not present

## 2015-03-21 DIAGNOSIS — M21611 Bunion of right foot: Secondary | ICD-10-CM

## 2015-03-21 MED ORDER — ALLOPURINOL 300 MG PO TABS: 300.0000 mg | ORAL_TABLET | Freq: Every day | ORAL | Status: DC

## 2015-03-21 NOTE — Progress Notes (Signed)
Subjective:     Patient ID: Patrick Baldwin, male   DOB: 03-Jun-1951, 64 y.o.   MRN: [id number]  HPI patient states I'm feeling real good and I'm having minimal discomfort and my foot   Review of Systems     Objective:   Physical Exam Neurovascular status intact negative Homans sign noted with well coapted incision site right first metatarsal and plantar incision where nodules were removed is healing well with stitches intact. Minimal edema and mild ecchymosis noted and no other pathology with good range of motion    Assessment:     Doing well post forefoot surgery right    Plan:     Reviewed x-rays reapplied sterile dressing advised on continued reduced activities elevation and compression. Reappoint in 2 weeks for suture removal or earlier if any issues should occur

## 2015-04-03 ENCOUNTER — Ambulatory Visit (INDEPENDENT_AMBULATORY_CARE_PROVIDER_SITE_OTHER): Payer: Commercial Managed Care - HMO

## 2015-04-03 DIAGNOSIS — Z23 Encounter for immunization: Secondary | ICD-10-CM | POA: Diagnosis not present

## 2015-04-04 ENCOUNTER — Ambulatory Visit (INDEPENDENT_AMBULATORY_CARE_PROVIDER_SITE_OTHER): Payer: Commercial Managed Care - HMO

## 2015-04-04 ENCOUNTER — Encounter: Payer: Self-pay | Admitting: Podiatry

## 2015-04-04 ENCOUNTER — Ambulatory Visit (INDEPENDENT_AMBULATORY_CARE_PROVIDER_SITE_OTHER): Payer: Commercial Managed Care - HMO | Admitting: Podiatry

## 2015-04-04 VITALS — BP 136/74 | HR 68 | Resp 16

## 2015-04-04 DIAGNOSIS — M722 Plantar fascial fibromatosis: Secondary | ICD-10-CM

## 2015-04-04 DIAGNOSIS — Z9889 Other specified postprocedural states: Secondary | ICD-10-CM | POA: Diagnosis not present

## 2015-04-04 DIAGNOSIS — M2011 Hallux valgus (acquired), right foot: Secondary | ICD-10-CM

## 2015-04-06 NOTE — Progress Notes (Signed)
Subjective:     Patient ID: Patrick Baldwin, male   DOB: 12/29/1950, 64 y.o.   MRN: [id number]  HPI patient comes in wearing tennis shoes against my advice currently with well-healed surgical site plantar aspect right foot and around the first metatarsal right with mild stress on the incision site due to noncompliance increased edema and localized redness with no drainage   Review of Systems     Objective:   Physical Exam Neurovascular status found to be intact muscle strength was adequate and I noted that the wound edges are healing well with no drainage with stitches in place and good range of motion of the first MPJ with moderate increase in swelling secondary to excessive activity and shoe gear choices that were not good    Assessment:     Doing okay but needs to be more compliant during the postoperative period    Plan:     Reviewed conditions with patient and the importance of him mobilization and removed stitches plantarly with wound edges remain coapted well with no drainage. Since there is some increased stress on the incision site as precautionary measure I placed on cephalexin 500 mg 3 times a day and I instructed to let us know if any redness drainage increased pain or swelling were to occur reviewed x-rays and reappoint in 4 weeks

## 2015-04-21 ENCOUNTER — Telehealth: Payer: Self-pay | Admitting: *Deleted

## 2015-04-23 NOTE — Telephone Encounter (Signed)
Entered in error

## 2015-05-02 ENCOUNTER — Ambulatory Visit (INDEPENDENT_AMBULATORY_CARE_PROVIDER_SITE_OTHER): Payer: Commercial Managed Care - HMO | Admitting: Podiatry

## 2015-05-02 ENCOUNTER — Ambulatory Visit (INDEPENDENT_AMBULATORY_CARE_PROVIDER_SITE_OTHER): Payer: Commercial Managed Care - HMO

## 2015-05-02 DIAGNOSIS — M722 Plantar fascial fibromatosis: Secondary | ICD-10-CM

## 2015-05-02 DIAGNOSIS — M2011 Hallux valgus (acquired), right foot: Secondary | ICD-10-CM | POA: Diagnosis not present

## 2015-05-02 DIAGNOSIS — Z9889 Other specified postprocedural states: Secondary | ICD-10-CM

## 2015-05-02 DIAGNOSIS — M779 Enthesopathy, unspecified: Secondary | ICD-10-CM | POA: Diagnosis not present

## 2015-05-02 MED ORDER — TRIAMCINOLONE ACETONIDE 10 MG/ML IJ SUSP
10.0000 mg | Freq: Once | INTRAMUSCULAR | Status: AC
  Administered 2015-05-02: 10 mg

## 2015-05-04 NOTE — Progress Notes (Signed)
Subjective:     Patient ID: Patrick Baldwin, male   DOB: 11-30-1950, 64 y.o.   MRN: [id number]  HPI patient states I'm doing well with my surgery but I'm getting some pain in my right ankle is a may be compensating as I started to walk on it more   Review of Systems     Objective:   Physical Exam Neurovascular status intact negative Homans sign noted with well-healed surgical site right first metatarsal with mild edema still present. Patient's noted to have quite a bit of discomfort posterior tibial tendon under the medial malleolus with no indication currently of muscle strength loss    Assessment:     Doing well post osteotomy first metatarsal right with tendinitis posterior tibial    Plan:     Reviewed x-rays and explained condition and did a careful injection of the sheath posterior tibial 3 mg dexamethasone Kenalog 5 mg Xylocaine and advised on physical therapy. Patient will continue activity and I did dispensed ankle brace and will be seen back to recheck

## 2015-05-09 ENCOUNTER — Encounter: Payer: Self-pay | Admitting: Podiatry

## 2015-05-30 ENCOUNTER — Ambulatory Visit (INDEPENDENT_AMBULATORY_CARE_PROVIDER_SITE_OTHER): Payer: Commercial Managed Care - HMO

## 2015-05-30 ENCOUNTER — Ambulatory Visit (INDEPENDENT_AMBULATORY_CARE_PROVIDER_SITE_OTHER): Payer: Commercial Managed Care - HMO | Admitting: Podiatry

## 2015-05-30 DIAGNOSIS — Z9889 Other specified postprocedural states: Secondary | ICD-10-CM

## 2015-05-30 DIAGNOSIS — M722 Plantar fascial fibromatosis: Secondary | ICD-10-CM

## 2015-05-30 DIAGNOSIS — M2011 Hallux valgus (acquired), right foot: Secondary | ICD-10-CM

## 2015-06-01 NOTE — Progress Notes (Signed)
Subjective:     Patient ID: Patrick Baldwin, male   DOB: Jan 12, 1951, 64 y.o.   MRN: [id number]  HPI patient states my right big toe joint is doing well with mild edema still noted   Review of Systems     Objective:   Physical Exam Neurovascular status intact negative Homans sign noted and well-healing surgical site right first metatarsal with mild edema normal range of motion    Assessment:     Doing well post [place] osteotomy first metatarsal right    Plan:     Reviewed conditions and recommended continuation of wider-type shoes but resumption of normal activities based on x-ray today. Reappoint as needed and discharged at this time

## 2015-06-03 ENCOUNTER — Other Ambulatory Visit: Payer: Self-pay | Admitting: Internal Medicine

## 2015-06-05 NOTE — Telephone Encounter (Signed)
Done

## 2015-06-12 ENCOUNTER — Ambulatory Visit (INDEPENDENT_AMBULATORY_CARE_PROVIDER_SITE_OTHER): Payer: Commercial Managed Care - HMO | Admitting: Internal Medicine

## 2015-06-12 ENCOUNTER — Other Ambulatory Visit (INDEPENDENT_AMBULATORY_CARE_PROVIDER_SITE_OTHER): Payer: Commercial Managed Care - HMO

## 2015-06-12 ENCOUNTER — Encounter: Payer: Self-pay | Admitting: Internal Medicine

## 2015-06-12 VITALS — BP 128/70 | HR 70 | Wt 262.0 lb

## 2015-06-12 DIAGNOSIS — R635 Abnormal weight gain: Secondary | ICD-10-CM

## 2015-06-12 DIAGNOSIS — F411 Generalized anxiety disorder: Secondary | ICD-10-CM | POA: Diagnosis not present

## 2015-06-12 DIAGNOSIS — M10079 Idiopathic gout, unspecified ankle and foot: Secondary | ICD-10-CM

## 2015-06-12 DIAGNOSIS — E119 Type 2 diabetes mellitus without complications: Secondary | ICD-10-CM | POA: Diagnosis not present

## 2015-06-12 DIAGNOSIS — E291 Testicular hypofunction: Secondary | ICD-10-CM

## 2015-06-12 DIAGNOSIS — N529 Male erectile dysfunction, unspecified: Secondary | ICD-10-CM

## 2015-06-12 LAB — CBC WITH DIFFERENTIAL/PLATELET
BASOS PCT: 0.3 % (ref 0.0–3.0)
Basophils Absolute: 0 10*3/uL (ref 0.0–0.1)
EOS PCT: 2.1 % (ref 0.0–5.0)
Eosinophils Absolute: 0.2 10*3/uL (ref 0.0–0.7)
HCT: 52.2 % — ABNORMAL HIGH (ref 39.0–52.0)
Hemoglobin: 17.3 g/dL — ABNORMAL HIGH (ref 13.0–17.0)
LYMPHS ABS: 2.1 10*3/uL (ref 0.7–4.0)
Lymphocytes Relative: 19.5 % (ref 12.0–46.0)
MCHC: 33.1 g/dL (ref 30.0–36.0)
MCV: 88.3 fl (ref 78.0–100.0)
MONO ABS: 0.9 10*3/uL (ref 0.1–1.0)
Monocytes Relative: 7.9 % (ref 3.0–12.0)
NEUTROS PCT: 70.2 % (ref 43.0–77.0)
Neutro Abs: 7.6 10*3/uL (ref 1.4–7.7)
PLATELETS: 276 10*3/uL (ref 150.0–400.0)
RBC: 5.91 Mil/uL — ABNORMAL HIGH (ref 4.22–5.81)
RDW: 14.5 % (ref 11.5–15.5)
WBC: 10.8 10*3/uL — ABNORMAL HIGH (ref 4.0–10.5)

## 2015-06-12 LAB — BASIC METABOLIC PANEL
BUN: 13 mg/dL (ref 6–23)
CALCIUM: 10 mg/dL (ref 8.4–10.5)
CO2: 32 meq/L (ref 19–32)
Chloride: 101 mEq/L (ref 96–112)
Creatinine, Ser: 1.24 mg/dL (ref 0.40–1.50)
GFR: 62.28 mL/min (ref >60.00)
Glucose, Bld: 122 mg/dL — ABNORMAL HIGH (ref 70–99)
POTASSIUM: 4.6 meq/L (ref 3.5–5.1)
SODIUM: 139 meq/L (ref 135–145)

## 2015-06-12 LAB — PSA: PSA: 1.37 ng/mL (ref 0.10–4.00)

## 2015-06-12 LAB — HEPATIC FUNCTION PANEL
ALK PHOS: 68 U/L (ref 39–117)
ALT: 29 U/L (ref 0–53)
AST: 20 U/L (ref 0–37)
Albumin: 4.4 g/dL (ref 3.5–5.2)
BILIRUBIN DIRECT: 0.1 mg/dL (ref 0.0–0.3)
TOTAL PROTEIN: 7.5 g/dL (ref 6.0–8.3)
Total Bilirubin: 0.6 mg/dL (ref 0.2–1.2)

## 2015-06-12 LAB — LIPID PANEL
CHOLESTEROL: 190 mg/dL (ref 0–200)
HDL: 35 mg/dL — AB (ref >39.00)
LDL Cholesterol: 117 mg/dL — ABNORMAL HIGH (ref 0–99)
NONHDL: 154.9
Total CHOL/HDL Ratio: 5
Triglycerides: 190 mg/dL — ABNORMAL HIGH (ref 0.0–149.0)
VLDL: 38 mg/dL (ref 0.0–40.0)

## 2015-06-12 LAB — HEMOGLOBIN A1C: Hgb A1c MFr Bld: 6.5 % (ref 4.6–6.5)

## 2015-06-12 LAB — TESTOSTERONE: TESTOSTERONE: 796.99 ng/dL (ref 300.00–890.00)

## 2015-06-12 MED ORDER — TESTOSTERONE 10 MG/ACT (2%) TD GEL: TRANSDERMAL | Status: DC

## 2015-06-12 NOTE — Assessment & Plan Note (Signed)
Doing well 

## 2015-06-12 NOTE — Assessment & Plan Note (Signed)
Wt Readings from Last 3 Encounters:  06/12/15 262 lb (118.842 kg)  02/10/15 260 lb (117.935 kg)  08/12/14 263 lb 4 oz (119.409 kg)

## 2015-06-12 NOTE — Progress Notes (Signed)
Subjective:  Patient ID: Patrick Baldwin, male    DOB: [birth year]  Age: 64 y.o. MRN: [id number]  CC: No chief complaint on file.   HPI Patrick Baldwin presents for gout, hypogonadism f/u. Pt had a foot surgery  Outpatient Prescriptions Prior to Visit  Medication Sig Dispense Refill  . allopurinol (ZYLOPRIM) 300 MG tablet Take 1 tablet (300 mg total) by mouth daily. 30 tablet 3  . aspirin 81 MG tablet Take 81 mg by mouth daily.    Marland Kitchen b complex vitamins tablet Take 1 tablet by mouth daily.    . cholecalciferol (VITAMIN D) 1000 UNITS tablet Take 1,000 Units by mouth daily.    . colchicine 0.6 MG tablet Take two tabs (1.2mg  total) initially.  Take 0.6mg  1 hour later if needed.  Do not exceed 1.8mg  (3 tabs) in one day.  After that may take 1 tab 2-3 times per day for the duration of symptoms. 30 tablet 3  . esomeprazole (NEXIUM) 40 MG capsule Take 1 capsule (40 mg total) by mouth daily before breakfast. 90 capsule 3  . glucosamine-chondroitin 500-400 MG tablet Take 1 tablet by mouth 3 (three) times daily.    . Magnesium 400 MG CAPS Take 1 each by mouth daily.    . sildenafil (VIAGRA) 100 MG tablet take 1 tablet by mouth once daily if needed 12 tablet 5  . Testosterone 10 MG/ACT (2%) GEL APPLY 3 PUMPS ONTO THE SKIN DAILY 180 g 5  . TURMERIC PO Take 1 tablet by mouth daily. Reported on 06/12/2015    . oxaprozin (DAYPRO) 600 MG tablet Take 1 tablet (600 mg total) by mouth daily. (Patient not taking: Reported on 06/12/2015) 50 tablet 1  . oxyCODONE-acetaminophen (PERCOCET) 10-325 MG per tablet Take 1 tablet by mouth every 4 (four) hours as needed for pain (one or two by mouth every 4-6 hours prn pain). Reported on 06/12/2015     No facility-administered medications prior to visit.    ROS Review of Systems  Constitutional: Negative for appetite change, fatigue and unexpected weight change.  HENT: Negative for congestion, nosebleeds, sneezing, sore throat and trouble swallowing.   Eyes: Negative for  itching and visual disturbance.  Respiratory: Negative for cough.   Cardiovascular: Negative for chest pain, palpitations and leg swelling.  Gastrointestinal: Negative for nausea, diarrhea, blood in stool and abdominal distention.  Genitourinary: Negative for frequency and hematuria.  Musculoskeletal: Negative for back pain, joint swelling, gait problem and neck pain.  Skin: Negative for rash.  Neurological: Negative for dizziness, tremors, speech difficulty and weakness.  Psychiatric/Behavioral: Negative for sleep disturbance, dysphoric mood and agitation. The patient is not nervous/anxious.     Objective:  BP 128/70 mmHg  Pulse 70  Wt 262 lb (118.842 kg)  SpO2 96%  BP Readings from Last 3 Encounters:  06/12/15 128/70  04/04/15 136/74  03/21/15 136/74    Wt Readings from Last 3 Encounters:  06/12/15 262 lb (118.842 kg)  02/10/15 260 lb (117.935 kg)  08/12/14 263 lb 4 oz (119.409 kg)    Physical Exam  Constitutional: He is oriented to person, place, and time. He appears well-developed. No distress.  NAD  HENT:  Mouth/Throat: Oropharynx is clear and moist.  Eyes: Conjunctivae are normal. Pupils are equal, round, and reactive to light.  Neck: Normal range of motion. No JVD present. No thyromegaly present.  Cardiovascular: Normal rate, regular rhythm, normal heart sounds and intact distal pulses.  Exam reveals no gallop and no friction rub.  No murmur heard. Pulmonary/Chest: Effort normal and breath sounds normal. No respiratory distress. He has no wheezes. He has no rales. He exhibits no tenderness.  Abdominal: Soft. Bowel sounds are normal. He exhibits no distension and no mass. There is no tenderness. There is no rebound and no guarding.  Musculoskeletal: Normal range of motion. He exhibits no edema or tenderness.  Lymphadenopathy:    He has no cervical adenopathy.  Neurological: He is alert and oriented to person, place, and time. He has normal reflexes. No cranial nerve  deficit. He exhibits normal muscle tone. He displays a negative Romberg sign. Coordination and gait normal.  Skin: Skin is warm and dry. No rash noted.  Psychiatric: He has a normal mood and affect. His behavior is normal. Judgment and thought content normal.  Obese  Lab Results  Component Value Date   WBC 10.8* 06/12/2015   HGB 17.3* 06/12/2015   HCT 52.2* 06/12/2015   PLT 276.0 06/12/2015   GLUCOSE 122* 06/12/2015   CHOL 190 06/12/2015   TRIG 190.0* 06/12/2015   HDL 35.00* 06/12/2015   LDLCALC 117* 06/12/2015   ALT 29 06/12/2015   AST 20 06/12/2015   NA 139 06/12/2015   K 4.6 06/12/2015   CL 101 06/12/2015   CREATININE 1.24 06/12/2015   BUN 13 06/12/2015   CO2 32 06/12/2015   TSH 0.84 08/02/2013   PSA 1.37 06/12/2015   INR 0.93 04/14/2012   HGBA1C 6.5 06/12/2015    No results found.  Assessment & Plan:   Diagnoses and all orders for this visit:  Acute idiopathic gout of foot, unspecified laterality -     Basic metabolic panel; Future -     Hepatic function panel; Future -     Hemoglobin A1c; Future -     PSA; Future -     Testosterone; Future -     Lipid panel; Future -     CBC with Differential/Platelet; Future  WEIGHT GAIN -     Basic metabolic panel; Future -     Hepatic function panel; Future -     Hemoglobin A1c; Future -     PSA; Future -     Testosterone; Future -     Lipid panel; Future -     CBC with Differential/Platelet; Future  Generalized anxiety disorder -     Basic metabolic panel; Future -     Hepatic function panel; Future -     Hemoglobin A1c; Future -     PSA; Future -     Testosterone; Future -     Lipid panel; Future -     CBC with Differential/Platelet; Future  Diabetic on diet only ([place]) -     Basic metabolic panel; Future -     Hepatic function panel; Future -     Hemoglobin A1c; Future -     PSA; Future -     Testosterone; Future -     Lipid panel; Future -     CBC with Differential/Platelet; Future  Hypogonadism  male -     Basic metabolic panel; Future -     Hepatic function panel; Future -     Hemoglobin A1c; Future -     PSA; Future -     Testosterone; Future -     Lipid panel; Future -     CBC with Differential/Platelet; Future  ERECTILE DYSFUNCTION -     Basic metabolic panel; Future -     Hepatic function panel; Future -  Hemoglobin A1c; Future -     PSA; Future -     Testosterone; Future -     Lipid panel; Future -     CBC with Differential/Platelet; Future  Other orders -     Testosterone 10 MG/ACT (2%) GEL; APPLY 3 PUMPS ONTO THE SKIN DAILY   I have discontinued Mr. Purdum oxaprozin and oxyCODONE-acetaminophen. I am also having him maintain his glucosamine-chondroitin, TURMERIC PO, aspirin, b complex vitamins, cholecalciferol, Magnesium, colchicine, esomeprazole, sildenafil, allopurinol, and Testosterone.  Meds ordered this encounter  Medications  . Testosterone 10 MG/ACT (2%) GEL    Sig: APPLY 3 PUMPS ONTO THE SKIN DAILY    Dispense:  180 g    Refill:  5     Follow-up: Return in about 6 months (around 12/11/2015) for Wellness Exam.  Walker Kehr, MD

## 2015-06-12 NOTE — Assessment & Plan Note (Signed)
Viagra prn 

## 2015-06-12 NOTE — Assessment & Plan Note (Signed)
Chronic - doing well ?

## 2015-06-12 NOTE — Assessment & Plan Note (Signed)
No recent relapse on Allopurinol Colchicine prn

## 2015-06-12 NOTE — Progress Notes (Signed)
Pre visit review using our clinic review tool, if applicable. No additional management support is needed unless otherwise documented below in the visit note. 

## 2015-06-12 NOTE — Assessment & Plan Note (Signed)
On Testosterone  Potential benefits of a long term testosterone  use as well as potential risks  and complications were explained to the patient and were aknowledged. Labs

## 2015-08-18 ENCOUNTER — Other Ambulatory Visit: Payer: Self-pay | Admitting: Podiatry

## 2015-08-20 ENCOUNTER — Encounter: Payer: Self-pay | Admitting: Internal Medicine

## 2015-08-21 ENCOUNTER — Other Ambulatory Visit: Payer: Self-pay

## 2015-08-21 LAB — HM COLONOSCOPY

## 2015-08-21 MED ORDER — ALLOPURINOL 300 MG PO TABS: 300.0000 mg | ORAL_TABLET | Freq: Every day | ORAL | Status: DC

## 2015-11-12 ENCOUNTER — Other Ambulatory Visit: Payer: Self-pay | Admitting: *Deleted

## 2015-11-12 MED ORDER — COLCHICINE 0.6 MG PO TABS: ORAL_TABLET | ORAL | Status: DC

## 2015-11-19 LAB — HM DIABETES EYE EXAM

## 2015-11-20 ENCOUNTER — Encounter: Payer: Self-pay | Admitting: Internal Medicine

## 2015-12-11 ENCOUNTER — Encounter: Payer: Self-pay | Admitting: Internal Medicine

## 2015-12-11 ENCOUNTER — Ambulatory Visit (INDEPENDENT_AMBULATORY_CARE_PROVIDER_SITE_OTHER): Payer: Medicare Other | Admitting: Internal Medicine

## 2015-12-11 VITALS — BP 138/64 | HR 67 | Wt 267.0 lb

## 2015-12-11 DIAGNOSIS — E291 Testicular hypofunction: Secondary | ICD-10-CM

## 2015-12-11 DIAGNOSIS — E119 Type 2 diabetes mellitus without complications: Secondary | ICD-10-CM

## 2015-12-11 DIAGNOSIS — R635 Abnormal weight gain: Secondary | ICD-10-CM

## 2015-12-11 DIAGNOSIS — K2100 Gastro-esophageal reflux disease with esophagitis, without bleeding: Secondary | ICD-10-CM

## 2015-12-11 DIAGNOSIS — M10079 Idiopathic gout, unspecified ankle and foot: Secondary | ICD-10-CM

## 2015-12-11 DIAGNOSIS — M5440 Lumbago with sciatica, unspecified side: Secondary | ICD-10-CM

## 2015-12-11 MED ORDER — TESTOSTERONE 20.25 MG/ACT (1.62%) TD GEL: 2.0000 | TRANSDERMAL | Status: DC

## 2015-12-11 MED ORDER — ALLOPURINOL 300 MG PO TABS: 300.0000 mg | ORAL_TABLET | Freq: Every day | ORAL | Status: DC

## 2015-12-11 MED ORDER — COLCHICINE 0.6 MG PO TABS: ORAL_TABLET | ORAL | Status: DC

## 2015-12-11 NOTE — Progress Notes (Signed)
Subjective:  Patient ID: Patrick Baldwin, male    DOB: February 25, 1951  Age: 65 y.o. MRN: [id number]  CC: No chief complaint on file.   HPI Patrick Baldwin presents for gout, GERD, ED, hypogonadism f/u. C/o hearing loss  Outpatient Prescriptions Prior to Visit  Medication Sig Dispense Refill  . allopurinol (ZYLOPRIM) 300 MG tablet Take 1 tablet (300 mg total) by mouth daily. 30 tablet 3  . aspirin 81 MG tablet Take 81 mg by mouth daily.    Marland Kitchen b complex vitamins tablet Take 1 tablet by mouth daily.    . cholecalciferol (VITAMIN D) 1000 UNITS tablet Take 1,000 Units by mouth daily.    . colchicine 0.6 MG tablet Take two tabs (1.2mg  total) initially.  Take 0.6mg  1 hour later if needed.  Do not exceed 1.8mg  (3 tabs) in one day.  After that may take 1 tab 2-3 times per day for the duration of symptoms. 30 tablet 0  . glucosamine-chondroitin 500-400 MG tablet Take 1 tablet by mouth 3 (three) times daily.    . Magnesium 400 MG CAPS Take 1 each by mouth daily.    . sildenafil (VIAGRA) 100 MG tablet take 1 tablet by mouth once daily if needed 12 tablet 5  . Testosterone 10 MG/ACT (2%) GEL APPLY 3 PUMPS ONTO THE SKIN DAILY 180 g 5  . TURMERIC PO Take 1 tablet by mouth daily. Reported on 06/12/2015    . esomeprazole (NEXIUM) 40 MG capsule Take 1 capsule (40 mg total) by mouth daily before breakfast. (Patient not taking: Reported on 12/11/2015) 90 capsule 3   No facility-administered medications prior to visit.    ROS Review of Systems  Constitutional: Negative for appetite change, fatigue and unexpected weight change.  HENT: Negative for congestion, nosebleeds, sneezing, sore throat and trouble swallowing.   Eyes: Negative for itching and visual disturbance.  Respiratory: Negative for cough.   Cardiovascular: Negative for chest pain, palpitations and leg swelling.  Gastrointestinal: Negative for nausea, diarrhea, blood in stool and abdominal distention.  Genitourinary: Negative for frequency and hematuria.   Musculoskeletal: Positive for arthralgias. Negative for back pain, joint swelling, gait problem and neck pain.  Skin: Negative for rash.  Neurological: Negative for dizziness, tremors, speech difficulty and weakness.  Psychiatric/Behavioral: Negative for sleep disturbance, dysphoric mood and agitation. The patient is not nervous/anxious.     Objective:  BP 138/64 mmHg  Pulse 67  Wt 267 lb (121.11 kg)  SpO2 97%  BP Readings from Last 3 Encounters:  12/11/15 138/64  06/12/15 128/70  04/04/15 136/74    Wt Readings from Last 3 Encounters:  12/11/15 267 lb (121.11 kg)  06/12/15 262 lb (118.842 kg)  02/10/15 260 lb (117.935 kg)    Physical Exam  Constitutional: He is oriented to person, place, and time. He appears well-developed. No distress.  NAD  HENT:  Mouth/Throat: Oropharynx is clear and moist.  Eyes: Conjunctivae are normal. Pupils are equal, round, and reactive to light.  Neck: Normal range of motion. No JVD present. No thyromegaly present.  Cardiovascular: Normal rate, regular rhythm, normal heart sounds and intact distal pulses.  Exam reveals no gallop and no friction rub.   No murmur heard. Pulmonary/Chest: Effort normal and breath sounds normal. No respiratory distress. He has no wheezes. He has no rales. He exhibits no tenderness.  Abdominal: Soft. Bowel sounds are normal. He exhibits no distension and no mass. There is no tenderness. There is no rebound and no guarding.  Musculoskeletal:  Normal range of motion. He exhibits tenderness. He exhibits no edema.  Lymphadenopathy:    He has no cervical adenopathy.  Neurological: He is alert and oriented to person, place, and time. He has normal reflexes. No cranial nerve deficit. He exhibits normal muscle tone. He displays a negative Romberg sign. Coordination abnormal. Gait normal.  Skin: Skin is warm and dry. No rash noted.  Psychiatric: He has a normal mood and affect. His behavior is normal. Judgment and thought content  normal.  Obese Varic veins B R foot - flat w/scars Knees tender B  Lab Results  Component Value Date   WBC 10.8* 06/12/2015   HGB 17.3* 06/12/2015   HCT 52.2* 06/12/2015   PLT 276.0 06/12/2015   GLUCOSE 122* 06/12/2015   CHOL 190 06/12/2015   TRIG 190.0* 06/12/2015   HDL 35.00* 06/12/2015   LDLCALC 117* 06/12/2015   ALT 29 06/12/2015   AST 20 06/12/2015   NA 139 06/12/2015   K 4.6 06/12/2015   CL 101 06/12/2015   CREATININE 1.24 06/12/2015   BUN 13 06/12/2015   CO2 32 06/12/2015   TSH 0.84 08/02/2013   PSA 1.37 06/12/2015   INR 0.93 04/14/2012   HGBA1C 6.5 06/12/2015    No results found.  Assessment & Plan:   There are no diagnoses linked to this encounter. I am having Mr. Mckeone maintain his glucosamine-chondroitin, TURMERIC PO, aspirin, b complex vitamins, cholecalciferol, Magnesium, sildenafil, Testosterone, allopurinol, colchicine, and esomeprazole.  Meds ordered this encounter  Medications  . esomeprazole (NEXIUM) 20 MG capsule    Sig: Take 40 mg by mouth daily at 12 noon.     Follow-up: No Follow-up on file.  Walker Kehr, MD

## 2015-12-11 NOTE — Assessment & Plan Note (Signed)
Labs Try colchicine

## 2015-12-11 NOTE — Progress Notes (Signed)
Pre visit review using our clinic review tool, if applicable. No additional management support is needed unless otherwise documented below in the visit note. 

## 2015-12-11 NOTE — Assessment & Plan Note (Signed)
Wt Readings from Last 3 Encounters:  12/11/15 267 lb (121.11 kg)  06/12/15 262 lb (118.842 kg)  02/10/15 260 lb (117.935 kg)

## 2015-12-11 NOTE — Assessment & Plan Note (Signed)
Labs

## 2015-12-11 NOTE — Assessment & Plan Note (Signed)
On Testosterone: Androgel  Potential benefits of a long term testosterone  use as well as potential risks  and complications were explained to the patient and were aknowledged.

## 2015-12-11 NOTE — Assessment & Plan Note (Signed)
Chronic  On Nexium 

## 2015-12-11 NOTE — Assessment & Plan Note (Signed)
Doing ok.

## 2015-12-11 NOTE — Patient Instructions (Signed)
Contrave, Belviq for weight loss

## 2016-01-13 ENCOUNTER — Other Ambulatory Visit (INDEPENDENT_AMBULATORY_CARE_PROVIDER_SITE_OTHER): Payer: Medicare Other

## 2016-01-13 DIAGNOSIS — E291 Testicular hypofunction: Secondary | ICD-10-CM

## 2016-01-13 DIAGNOSIS — M10079 Idiopathic gout, unspecified ankle and foot: Secondary | ICD-10-CM | POA: Diagnosis not present

## 2016-01-13 DIAGNOSIS — M5440 Lumbago with sciatica, unspecified side: Secondary | ICD-10-CM

## 2016-01-13 DIAGNOSIS — K2100 Gastro-esophageal reflux disease with esophagitis, without bleeding: Secondary | ICD-10-CM

## 2016-01-13 DIAGNOSIS — E119 Type 2 diabetes mellitus without complications: Secondary | ICD-10-CM | POA: Diagnosis not present

## 2016-01-13 DIAGNOSIS — R635 Abnormal weight gain: Secondary | ICD-10-CM | POA: Diagnosis not present

## 2016-01-13 LAB — URINALYSIS
Bilirubin Urine: NEGATIVE
HGB URINE DIPSTICK: NEGATIVE
Ketones, ur: NEGATIVE
LEUKOCYTES UA: NEGATIVE
NITRITE: NEGATIVE
SPECIFIC GRAVITY, URINE: 1.02 (ref 1.000–1.030)
UROBILINOGEN UA: 0.2 (ref 0.0–1.0)
Urine Glucose: NEGATIVE
pH: 6 (ref 5.0–8.0)

## 2016-01-13 LAB — CBC WITH DIFFERENTIAL/PLATELET
Basophils Absolute: 0 K/uL (ref 0.0–0.1)
Basophils Relative: 0.3 % (ref 0.0–3.0)
Eosinophils Absolute: 0.2 K/uL (ref 0.0–0.7)
Eosinophils Relative: 2 % (ref 0.0–5.0)
HCT: 49.7 % (ref 39.0–52.0)
Hemoglobin: 16.9 g/dL (ref 13.0–17.0)
Lymphocytes Relative: 19.1 % (ref 12.0–46.0)
Lymphs Abs: 1.8 K/uL (ref 0.7–4.0)
MCHC: 33.9 g/dL (ref 30.0–36.0)
MCV: 88.7 fl (ref 78.0–100.0)
Monocytes Absolute: 0.8 K/uL (ref 0.1–1.0)
Monocytes Relative: 8.7 % (ref 3.0–12.0)
Neutro Abs: 6.6 K/uL (ref 1.4–7.7)
Neutrophils Relative %: 69.9 % (ref 43.0–77.0)
Platelets: 234 K/uL (ref 150.0–400.0)
RBC: 5.6 Mil/uL (ref 4.22–5.81)
RDW: 13.6 % (ref 11.5–15.5)
WBC: 9.4 K/uL (ref 4.0–10.5)

## 2016-01-13 LAB — BASIC METABOLIC PANEL
BUN: 13 mg/dL (ref 6–23)
CALCIUM: 9.6 mg/dL (ref 8.4–10.5)
CHLORIDE: 106 meq/L (ref 96–112)
CO2: 26 meq/L (ref 19–32)
CREATININE: 1.4 mg/dL (ref 0.40–1.50)
GFR: 54.04 mL/min — ABNORMAL LOW (ref >60.00)
GLUCOSE: 132 mg/dL — AB (ref 70–99)
Potassium: 4.1 mEq/L (ref 3.5–5.1)
Sodium: 141 mEq/L (ref 135–145)

## 2016-01-13 LAB — LIPID PANEL
CHOLESTEROL: 179 mg/dL (ref 0–200)
HDL: 27.8 mg/dL — AB (ref >39.00)
NONHDL: 151.04
TRIGLYCERIDES: 254 mg/dL — AB (ref 0.0–149.0)
Total CHOL/HDL Ratio: 6
VLDL: 50.8 mg/dL — ABNORMAL HIGH (ref 0.0–40.0)

## 2016-01-13 LAB — LDL CHOLESTEROL, DIRECT: LDL DIRECT: 118 mg/dL

## 2016-01-13 LAB — URIC ACID: URIC ACID, SERUM: 6.6 mg/dL (ref 4.0–7.8)

## 2016-01-13 LAB — HEMOGLOBIN A1C: HEMOGLOBIN A1C: 6.9 % — AB (ref 4.6–6.5)

## 2016-01-13 LAB — HEPATIC FUNCTION PANEL
ALT: 33 U/L (ref 0–53)
AST: 24 U/L (ref 0–37)
Albumin: 4.5 g/dL (ref 3.5–5.2)
Alkaline Phosphatase: 57 U/L (ref 39–117)
Bilirubin, Direct: 0.1 mg/dL (ref 0.0–0.3)
Total Bilirubin: 0.4 mg/dL (ref 0.2–1.2)
Total Protein: 7.4 g/dL (ref 6.0–8.3)

## 2016-01-13 LAB — PSA: PSA: 1.08 ng/mL (ref 0.10–4.00)

## 2016-01-13 LAB — TESTOSTERONE: Testosterone: 225.87 ng/dL — ABNORMAL LOW (ref 300.00–890.00)

## 2016-01-14 LAB — HEPATITIS C ANTIBODY: HCV AB: NEGATIVE

## 2016-04-12 ENCOUNTER — Encounter: Payer: Medicare Other | Admitting: Internal Medicine

## 2016-04-19 ENCOUNTER — Other Ambulatory Visit (INDEPENDENT_AMBULATORY_CARE_PROVIDER_SITE_OTHER): Payer: Medicare Other

## 2016-04-19 ENCOUNTER — Encounter: Payer: Self-pay | Admitting: Internal Medicine

## 2016-04-19 ENCOUNTER — Ambulatory Visit (INDEPENDENT_AMBULATORY_CARE_PROVIDER_SITE_OTHER): Payer: Medicare Other | Admitting: Internal Medicine

## 2016-04-19 VITALS — BP 120/70 | HR 58 | Temp 99.2°F | Ht 70.0 in | Wt 273.0 lb

## 2016-04-19 DIAGNOSIS — Z0000 Encounter for general adult medical examination without abnormal findings: Secondary | ICD-10-CM | POA: Diagnosis not present

## 2016-04-19 DIAGNOSIS — E291 Testicular hypofunction: Secondary | ICD-10-CM

## 2016-04-19 DIAGNOSIS — Z23 Encounter for immunization: Secondary | ICD-10-CM | POA: Diagnosis not present

## 2016-04-19 DIAGNOSIS — E785 Hyperlipidemia, unspecified: Secondary | ICD-10-CM

## 2016-04-19 LAB — BASIC METABOLIC PANEL
BUN: 14 mg/dL (ref 6–23)
CALCIUM: 9.6 mg/dL (ref 8.4–10.5)
CHLORIDE: 103 meq/L (ref 96–112)
CO2: 31 meq/L (ref 19–32)
Creatinine, Ser: 1.45 mg/dL (ref 0.40–1.50)
GFR: 51.85 mL/min — ABNORMAL LOW (ref >60.00)
Glucose, Bld: 140 mg/dL — ABNORMAL HIGH (ref 70–99)
Potassium: 4.9 mEq/L (ref 3.5–5.1)
SODIUM: 141 meq/L (ref 135–145)

## 2016-04-19 LAB — CBC WITH DIFFERENTIAL/PLATELET
BASOS ABS: 0 10*3/uL (ref 0.0–0.1)
Basophils Relative: 0.2 % (ref 0.0–3.0)
EOS ABS: 0.2 10*3/uL (ref 0.0–0.7)
Eosinophils Relative: 1.7 % (ref 0.0–5.0)
HCT: 50 % (ref 39.0–52.0)
Hemoglobin: 16.7 g/dL (ref 13.0–17.0)
LYMPHS ABS: 2 10*3/uL (ref 0.7–4.0)
Lymphocytes Relative: 20.1 % (ref 12.0–46.0)
MCHC: 33.3 g/dL (ref 30.0–36.0)
MCV: 90 fl (ref 78.0–100.0)
MONOS PCT: 8.1 % (ref 3.0–12.0)
Monocytes Absolute: 0.8 10*3/uL (ref 0.1–1.0)
NEUTROS PCT: 69.9 % (ref 43.0–77.0)
Neutro Abs: 6.8 10*3/uL (ref 1.4–7.7)
PLATELETS: 231 10*3/uL (ref 150.0–400.0)
RBC: 5.55 Mil/uL (ref 4.22–5.81)
RDW: 13.8 % (ref 11.5–15.5)
WBC: 9.7 10*3/uL (ref 4.0–10.5)

## 2016-04-19 LAB — HEPATIC FUNCTION PANEL
ALK PHOS: 52 U/L (ref 39–117)
ALT: 40 U/L (ref 0–53)
AST: 30 U/L (ref 0–37)
Albumin: 4.6 g/dL (ref 3.5–5.2)
BILIRUBIN DIRECT: 0.1 mg/dL (ref 0.0–0.3)
BILIRUBIN TOTAL: 0.7 mg/dL (ref 0.2–1.2)
TOTAL PROTEIN: 7.4 g/dL (ref 6.0–8.3)

## 2016-04-19 LAB — LIPID PANEL
CHOL/HDL RATIO: 6
Cholesterol: 183 mg/dL (ref 0–200)
HDL: 33.3 mg/dL — AB (ref >39.00)
LDL CALC: 114 mg/dL — AB (ref 0–99)
NONHDL: 150.05
Triglycerides: 182 mg/dL — ABNORMAL HIGH (ref 0.0–149.0)
VLDL: 36.4 mg/dL (ref 0.0–40.0)

## 2016-04-19 LAB — URINALYSIS
Bilirubin Urine: NEGATIVE
HGB URINE DIPSTICK: NEGATIVE
KETONES UR: NEGATIVE
LEUKOCYTES UA: NEGATIVE
Nitrite: NEGATIVE
SPECIFIC GRAVITY, URINE: 1.01 (ref 1.000–1.030)
Total Protein, Urine: NEGATIVE
URINE GLUCOSE: NEGATIVE
UROBILINOGEN UA: 0.2 (ref 0.0–1.0)
pH: 7 (ref 5.0–8.0)

## 2016-04-19 LAB — PSA: PSA: 1.19 ng/mL (ref 0.10–4.00)

## 2016-04-19 LAB — TSH: TSH: 1.43 u[IU]/mL (ref 0.35–4.50)

## 2016-04-19 LAB — TESTOSTERONE: Testosterone: 578.66 ng/dL (ref 300.00–890.00)

## 2016-04-19 NOTE — Patient Instructions (Signed)

## 2016-04-19 NOTE — Assessment & Plan Note (Signed)
Labs

## 2016-04-19 NOTE — Progress Notes (Signed)
Pre visit review using our clinic review tool, if applicable. No additional management support is needed unless otherwise documented below in the visit note. 

## 2016-04-19 NOTE — Addendum Note (Signed)
Addended by: Cresenciano Lick on: 04/19/2016 01:52 PM   Modules accepted: Orders

## 2016-04-19 NOTE — Progress Notes (Signed)
Subjective:  Patient ID: Patrick Baldwin, male    DOB: 1950/11/02  Age: 65 y.o. MRN: [id number]  CC: No chief complaint on file.   HPI Patrick Baldwin presents for a well exam. Moving to [place] in Feb 2018  Outpatient Medications Prior to Visit  Medication Sig Dispense Refill  . allopurinol (ZYLOPRIM) 300 MG tablet Take 1 tablet (300 mg total) by mouth daily. 30 tablet 5  . aspirin 81 MG tablet Take 81 mg by mouth daily.    Marland Kitchen b complex vitamins tablet Take 1 tablet by mouth daily.    . cholecalciferol (VITAMIN D) 1000 UNITS tablet Take 1,000 Units by mouth daily.    . colchicine 0.6 MG tablet Take two tabs (1.2mg  total) initially.  Take 0.6mg  1 hour later if needed.  Do not exceed 1.8mg  (3 tabs) in one day.  After that may take 1 tab 2-3 times per day for the duration of symptoms. 30 tablet 5  . esomeprazole (NEXIUM) 20 MG capsule Take 40 mg by mouth daily at 12 noon.    Marland Kitchen glucosamine-chondroitin 500-400 MG tablet Take 1 tablet by mouth 3 (three) times daily.    . Magnesium 400 MG CAPS Take 1 each by mouth daily.    . sildenafil (VIAGRA) 100 MG tablet take 1 tablet by mouth once daily if needed 12 tablet 5  . Testosterone (ANDROGEL PUMP) 20.25 MG/ACT (1.62%) GEL Place 2 Act onto the skin every morning. 75 g 5  . TURMERIC PO Take 1 tablet by mouth daily. Reported on 12/11/2015     No facility-administered medications prior to visit.     ROS Review of Systems  Constitutional: Positive for unexpected weight change. Negative for appetite change and fatigue.  HENT: Negative for congestion, nosebleeds, sneezing, sore throat and trouble swallowing.   Eyes: Negative for itching and visual disturbance.  Respiratory: Negative for cough.   Cardiovascular: Negative for chest pain, palpitations and leg swelling.  Gastrointestinal: Negative for abdominal distention, blood in stool, diarrhea and nausea.  Genitourinary: Negative for frequency and hematuria.  Musculoskeletal: Negative for back pain,  gait problem, joint swelling and neck pain.  Skin: Negative for rash.  Neurological: Negative for dizziness, tremors, speech difficulty and weakness.  Psychiatric/Behavioral: Negative for agitation, dysphoric mood, sleep disturbance and suicidal ideas. The patient is not nervous/anxious.     Objective:  BP 120/70   Pulse (!) 58   Temp 99.2 F (37.3 C) (Oral)   Ht 5\' 10"  (1.778 m)   Wt 273 lb (123.8 kg)   SpO2 96%   BMI 39.17 kg/m   BP Readings from Last 3 Encounters:  04/19/16 120/70  12/11/15 138/64  06/12/15 128/70    Wt Readings from Last 3 Encounters:  04/19/16 273 lb (123.8 kg)  12/11/15 267 lb (121.1 kg)  06/12/15 262 lb (118.8 kg)    Physical Exam  Constitutional: He is oriented to person, place, and time. He appears well-developed and well-nourished. No distress.  NAD  HENT:  Head: Normocephalic and atraumatic.  Right Ear: External ear normal.  Left Ear: External ear normal.  Nose: Nose normal.  Mouth/Throat: Oropharynx is clear and moist. No oropharyngeal exudate.  Eyes: Conjunctivae and EOM are normal. Pupils are equal, round, and reactive to light. Right eye exhibits no discharge. Left eye exhibits no discharge. No scleral icterus.  Neck: Normal range of motion. Neck supple. No JVD present. No tracheal deviation present. No thyromegaly present.  Cardiovascular: Normal rate, regular rhythm, normal  heart sounds and intact distal pulses.  Exam reveals no gallop and no friction rub.   No murmur heard. Pulmonary/Chest: Effort normal and breath sounds normal. No stridor. No respiratory distress. He has no wheezes. He has no rales. He exhibits no tenderness.  Abdominal: Soft. Bowel sounds are normal. He exhibits no distension and no mass. There is no tenderness. There is no rebound and no guarding.  Genitourinary: Rectum normal, prostate normal and penis normal. Rectal exam shows guaiac negative stool. No penile tenderness.  Musculoskeletal: Normal range of motion. He  exhibits no edema or tenderness.  Lymphadenopathy:    He has no cervical adenopathy.  Neurological: He is alert and oriented to person, place, and time. He has normal reflexes. No cranial nerve deficit. He exhibits normal muscle tone. He displays a negative Romberg sign. Coordination and gait normal.  Skin: Skin is warm and dry. No rash noted. He is not diaphoretic. No erythema. No pallor.  Psychiatric: He has a normal mood and affect. His behavior is normal. Judgment and thought content normal.  Obese Knees hurt  Lab Results  Component Value Date   WBC 9.4 01/13/2016   HGB 16.9 01/13/2016   HCT 49.7 01/13/2016   PLT 234.0 01/13/2016   GLUCOSE 132 (H) 01/13/2016   CHOL 179 01/13/2016   TRIG 254.0 (H) 01/13/2016   HDL 27.80 (L) 01/13/2016   LDLDIRECT 118.0 01/13/2016   LDLCALC 117 (H) 06/12/2015   ALT 33 01/13/2016   AST 24 01/13/2016   NA 141 01/13/2016   K 4.1 01/13/2016   CL 106 01/13/2016   CREATININE 1.40 01/13/2016   BUN 13 01/13/2016   CO2 26 01/13/2016   TSH 0.84 08/02/2013   PSA 1.08 01/13/2016   INR 0.93 04/14/2012   HGBA1C 6.9 (H) 01/13/2016    No results found.  Assessment & Plan:   There are no diagnoses linked to this encounter. I am having Patrick Baldwin maintain his glucosamine-chondroitin, TURMERIC PO, aspirin, b complex vitamins, cholecalciferol, Magnesium, sildenafil, esomeprazole, colchicine, allopurinol, and Testosterone.  No orders of the defined types were placed in this encounter.    Follow-up: No Follow-up on file.  Walker Kehr, MD

## 2016-04-19 NOTE — Assessment & Plan Note (Addendum)
Here for medicare wellness/physical  Diet: heart healthy  Physical activity: not sedentary  Depression/mood screen: negative  Hearing: intact to whispered voice  Visual acuity: grossly normal, performs annual eye exam  ADLs: capable  Fall risk: low to none  Home safety: good  Cognitive evaluation: intact to orientation, naming, recall and repetition  EOL planning: adv directives, full code/ I agree  I have personally reviewed and have noted  1. The patient's medical, surgical and social history  2. Their use of alcohol, tobacco or illicit drugs  3. Their current medications and supplements  4. The patient's functional ability including ADL's, fall risks, home safety risks and hearing or visual impairment.  5. Diet and physical activities  6. Evidence for depression or mood disorders 7. The roster of all physicians providing medical care to patient - is listed in the Snapshot section of the chart and reviewed today.    Today patient counseled on age appropriate routine health concerns for screening and prevention, each reviewed and up to date or declined. Immunizations reviewed and up to date or declined. Labs ordered and reviewed. Risk factors for depression reviewed and negative. Hearing function and visual acuity are intact. ADLs screened and addressed as needed. Functional ability and level of safety reviewed and appropriate. Education, counseling and referrals performed based on assessed risks today. Patient provided with a copy of personalized plan for preventive services.   Moving to [place]  in Feb 2018

## 2016-06-21 ENCOUNTER — Other Ambulatory Visit: Payer: Self-pay | Admitting: Internal Medicine

## 2016-06-22 NOTE — Telephone Encounter (Signed)
Faxed to [facility] 

## 2016-06-28 ENCOUNTER — Other Ambulatory Visit: Payer: Self-pay | Admitting: *Deleted

## 2016-06-28 MED ORDER — COLCHICINE 0.6 MG PO TABS: ORAL_TABLET | ORAL | 5 refills | Status: AC

## 2016-06-28 NOTE — Telephone Encounter (Signed)
Rx printed & faxed to pharmacy 

## 2017-04-07 ENCOUNTER — Encounter: Payer: Self-pay | Admitting: Internal Medicine

## 2017-04-11 ENCOUNTER — Encounter: Payer: Self-pay | Admitting: Internal Medicine
